# Patient Record
Sex: Female | Born: 1970 | Race: Black or African American | Hispanic: No | Smoking: Former smoker
Health system: Southern US, Community
[De-identification: ages and names within clinical notes are randomized; demographics above are authoritative.]

## PROBLEM LIST (undated history)

## (undated) DIAGNOSIS — K5790 Diverticulosis of intestine, part unspecified, without perforation or abscess without bleeding: Secondary | ICD-10-CM

## (undated) DIAGNOSIS — K8020 Calculus of gallbladder without cholecystitis without obstruction: Secondary | ICD-10-CM

## (undated) DIAGNOSIS — I10 Essential (primary) hypertension: Secondary | ICD-10-CM

## (undated) DIAGNOSIS — IMO0002 Reserved for concepts with insufficient information to code with codable children: Secondary | ICD-10-CM

## (undated) DIAGNOSIS — E079 Disorder of thyroid, unspecified: Secondary | ICD-10-CM

## (undated) DIAGNOSIS — D649 Anemia, unspecified: Secondary | ICD-10-CM

## (undated) HISTORY — DX: Anemia, unspecified: D64.9

## (undated) HISTORY — DX: Reserved for concepts with insufficient information to code with codable children: IMO0002

## (undated) HISTORY — DX: Disorder of thyroid, unspecified: E07.9

## (undated) HISTORY — DX: Diverticulosis of intestine, part unspecified, without perforation or abscess without bleeding: K57.90

## (undated) HISTORY — DX: Essential (primary) hypertension: I10

## (undated) HISTORY — DX: Calculus of gallbladder without cholecystitis without obstruction: K80.20

## (undated) HISTORY — PX: WISDOM TOOTH EXTRACTION: SHX21

---

## 1999-03-14 HISTORY — PX: CHOLECYSTECTOMY: SHX55

## 2010-03-02 ENCOUNTER — Ambulatory Visit: Payer: Self-pay | Admitting: Nurse Practitioner

## 2010-03-02 ENCOUNTER — Encounter (INDEPENDENT_AMBULATORY_CARE_PROVIDER_SITE_OTHER): Payer: Self-pay | Admitting: Internal Medicine

## 2010-03-02 ENCOUNTER — Encounter (INDEPENDENT_AMBULATORY_CARE_PROVIDER_SITE_OTHER): Payer: Self-pay | Admitting: Nurse Practitioner

## 2010-03-02 DIAGNOSIS — I10 Essential (primary) hypertension: Secondary | ICD-10-CM | POA: Insufficient documentation

## 2010-03-02 DIAGNOSIS — R5383 Other fatigue: Secondary | ICD-10-CM

## 2010-03-02 DIAGNOSIS — E669 Obesity, unspecified: Secondary | ICD-10-CM

## 2010-03-02 DIAGNOSIS — N926 Irregular menstruation, unspecified: Secondary | ICD-10-CM

## 2010-03-02 DIAGNOSIS — R5381 Other malaise: Secondary | ICD-10-CM | POA: Insufficient documentation

## 2010-03-03 ENCOUNTER — Encounter (INDEPENDENT_AMBULATORY_CARE_PROVIDER_SITE_OTHER): Payer: Self-pay | Admitting: Nurse Practitioner

## 2010-03-03 DIAGNOSIS — D509 Iron deficiency anemia, unspecified: Secondary | ICD-10-CM

## 2010-03-03 LAB — CONVERTED CEMR LAB
Basophils Absolute: 0 10*3/uL (ref 0.0–0.1)
Basophils Relative: 0 % (ref 0–1)
Eosinophils Absolute: 0.1 10*3/uL (ref 0.0–0.7)
Eosinophils Relative: 3 % (ref 0–5)
HCT: 33.8 % — ABNORMAL LOW (ref 36.0–46.0)
Hemoglobin: 10.4 g/dL — ABNORMAL LOW (ref 12.0–15.0)
Lymphocytes Relative: 29 % (ref 12–46)
Lymphs Abs: 1.5 10*3/uL (ref 0.7–4.0)
MCHC: 30.8 g/dL (ref 30.0–36.0)
MCV: 85.4 fL (ref 78.0–100.0)
Monocytes Absolute: 0.5 10*3/uL (ref 0.1–1.0)
Monocytes Relative: 9 % (ref 3–12)
Neutro Abs: 3.1 10*3/uL (ref 1.7–7.7)
Neutrophils Relative %: 59 % (ref 43–77)
Platelets: 374 10*3/uL (ref 150–400)
RBC: 3.96 M/uL (ref 3.87–5.11)
RDW: 14.2 % (ref 11.5–15.5)
TSH: 1.746 microintl units/mL (ref 0.350–4.500)
WBC: 5.2 10*3/uL (ref 4.0–10.5)

## 2010-03-13 DIAGNOSIS — R87619 Unspecified abnormal cytological findings in specimens from cervix uteri: Secondary | ICD-10-CM

## 2010-03-13 DIAGNOSIS — IMO0002 Reserved for concepts with insufficient information to code with codable children: Secondary | ICD-10-CM

## 2010-03-13 HISTORY — DX: Unspecified abnormal cytological findings in specimens from cervix uteri: R87.619

## 2010-03-13 HISTORY — DX: Reserved for concepts with insufficient information to code with codable children: IMO0002

## 2010-03-17 ENCOUNTER — Ambulatory Visit (HOSPITAL_COMMUNITY)
Admission: RE | Admit: 2010-03-17 | Discharge: 2010-03-17 | Payer: Self-pay | Source: Home / Self Care | Attending: Internal Medicine | Admitting: Internal Medicine

## 2010-03-18 ENCOUNTER — Telehealth (INDEPENDENT_AMBULATORY_CARE_PROVIDER_SITE_OTHER): Payer: Self-pay | Admitting: Nurse Practitioner

## 2010-03-18 DIAGNOSIS — D259 Leiomyoma of uterus, unspecified: Secondary | ICD-10-CM | POA: Insufficient documentation

## 2010-03-28 ENCOUNTER — Encounter (INDEPENDENT_AMBULATORY_CARE_PROVIDER_SITE_OTHER): Payer: Self-pay | Admitting: Nurse Practitioner

## 2010-03-29 ENCOUNTER — Ambulatory Visit
Admission: RE | Admit: 2010-03-29 | Discharge: 2010-03-29 | Payer: Self-pay | Source: Home / Self Care | Attending: Nurse Practitioner | Admitting: Nurse Practitioner

## 2010-03-29 ENCOUNTER — Other Ambulatory Visit: Payer: Self-pay | Admitting: Nurse Practitioner

## 2010-03-29 ENCOUNTER — Encounter (INDEPENDENT_AMBULATORY_CARE_PROVIDER_SITE_OTHER): Payer: Self-pay | Admitting: Nurse Practitioner

## 2010-03-29 ENCOUNTER — Other Ambulatory Visit
Admission: RE | Admit: 2010-03-29 | Discharge: 2010-03-29 | Payer: Self-pay | Source: Home / Self Care | Admitting: Nurse Practitioner

## 2010-03-29 LAB — CONVERTED CEMR LAB
Ketones, urine, test strip: NEGATIVE
Nitrite: NEGATIVE
Rapid HIV Screen: NEGATIVE
Urobilinogen, UA: NEGATIVE
pH: 6.5

## 2010-03-29 LAB — CYTOLOGY - PAP

## 2010-03-30 ENCOUNTER — Encounter (INDEPENDENT_AMBULATORY_CARE_PROVIDER_SITE_OTHER): Payer: Self-pay | Admitting: Nurse Practitioner

## 2010-03-30 DIAGNOSIS — E7800 Pure hypercholesterolemia, unspecified: Secondary | ICD-10-CM | POA: Insufficient documentation

## 2010-03-30 LAB — CONVERTED CEMR LAB
Albumin: 4.3 g/dL (ref 3.5–5.2)
CO2: 23 meq/L (ref 19–32)
Calcium: 9.4 mg/dL (ref 8.4–10.5)
Chloride: 105 meq/L (ref 96–112)
Cholesterol: 222 mg/dL — ABNORMAL HIGH (ref 0–200)
Glucose, Bld: 85 mg/dL (ref 70–99)
Potassium: 4.2 meq/L (ref 3.5–5.3)
Sodium: 142 meq/L (ref 135–145)
Total Bilirubin: 0.5 mg/dL (ref 0.3–1.2)
Total Protein: 7.2 g/dL (ref 6.0–8.3)
Triglycerides: 60 mg/dL (ref <150)

## 2010-03-31 ENCOUNTER — Telehealth (INDEPENDENT_AMBULATORY_CARE_PROVIDER_SITE_OTHER): Payer: Self-pay | Admitting: Nurse Practitioner

## 2010-04-08 ENCOUNTER — Ambulatory Visit (HOSPITAL_COMMUNITY)
Admission: RE | Admit: 2010-04-08 | Discharge: 2010-04-08 | Payer: Self-pay | Source: Home / Self Care | Attending: Internal Medicine | Admitting: Internal Medicine

## 2010-04-11 ENCOUNTER — Other Ambulatory Visit: Payer: Self-pay | Admitting: Family Medicine

## 2010-04-11 ENCOUNTER — Ambulatory Visit
Admission: RE | Admit: 2010-04-11 | Discharge: 2010-04-11 | Payer: Self-pay | Source: Home / Self Care | Attending: Obstetrics and Gynecology | Admitting: Obstetrics and Gynecology

## 2010-04-11 ENCOUNTER — Encounter: Payer: Self-pay | Admitting: Obstetrics & Gynecology

## 2010-04-11 LAB — CONVERTED CEMR LAB
HCT: 35.5 % — ABNORMAL LOW (ref 36.0–46.0)
Hemoglobin: 11.2 g/dL — ABNORMAL LOW (ref 12.0–15.0)
MCHC: 31.5 g/dL (ref 30.0–36.0)
MCV: 86.4 fL (ref 78.0–100.0)
RDW: 14.6 % (ref 11.5–15.5)

## 2010-04-12 ENCOUNTER — Encounter (INDEPENDENT_AMBULATORY_CARE_PROVIDER_SITE_OTHER): Payer: Self-pay | Admitting: Nurse Practitioner

## 2010-04-12 ENCOUNTER — Encounter: Payer: Self-pay | Admitting: Nurse Practitioner

## 2010-04-12 ENCOUNTER — Ambulatory Visit: Admit: 2010-04-12 | Payer: Self-pay | Admitting: Nurse Practitioner

## 2010-04-12 LAB — CONVERTED CEMR LAB
BUN: 12 mg/dL (ref 6–23)
Creatinine, Ser: 0.86 mg/dL (ref 0.40–1.20)

## 2010-04-12 NOTE — Progress Notes (Signed)
NAME:  [name], [name] NO.:  [id number]  MEDICAL RECORD NO.:  [id number]          PATIENT TYPE:  WOC  LOCATION:  [facility]                   FACILITY:  [facility]  PHYSICIAN:  Maryelizabeth Kaufmann, MD  DATE OF BIRTH:  02/04/1971  DATE OF SERVICE:                                 CLINIC NOTE  CHIEF COMPLAINT:  Uterine fibroids, heavy periods, and abnormal Pap smear evaluation.  HISTORY OF PRESENT ILLNESS:  This is a 40 year old gravida 2, para 2-0-0- 2 who is in for evaluation of heavy periods, which she says last approximately 10-14 days with use of increased pads and clots with dysmenorrhea along with anemia secondary to her menorrhagia.  Otherwise, she denies any vaginal discharge, itching, or odor.  She does have a family history with significant for uterine fibroids in her mother, which results in an hysterectomy in her early 30s per her report.  She states that she has had hormonal contraceptives in the past and is not currently clinically amendable to hormonal contraceptive at this time. Otherwise, she denies any other acute complaints other than she states she does have some PMS symptoms, some coldness due to the anemia, some fatigue as well due to the anemia.  PAST MEDICAL HISTORY:  Significant for hypertension and iron-deficiency anemia, currently controlled with hydrochlorothiazide and iron.  PAST SURGICAL HISTORY:  Significant for tonsillectomy and cholecystectomy.  MEDICATIONS: 1. Hydrochlorothiazide 25 mg one tab p.o. daily. 2. Iron 325 one tab p.o. daily.  SOCIAL HISTORY:  She denies any tobacco, alcohol, or drugs.  She is single and unemployed, but she does work as a Programmer, multimedia.  OB HISTORY:  She is gravida 2, para 2-0-0-2, status post full-term spontaneous vaginal delivery with her largest baby weighing 7 pounds and 14 ounces.  She is not on no form of contraception at this point.  GYN HISTORY:  She had menarche age 51, which are regular.  She  states her periods are currently lasting 12 days and are heavy.  She has no history of hormonal contraception.  No history of DVT or PE and this is her first abnormal Pap smear as well for which she is currently __________ evaluation for it.  FAMILY HISTORY:  Significant for her mother that had a hysterectomy secondary to fibroids as well as breast cancer diagnosed at age 68, status post mastectomy in her sister and also status post breast cancer diagnosis and treatment at age 89 as well.  She denies any history of BRCA gene testing at this time and her mammogram is currently up-to-date and she denies any history of problems with that.  PHYSICAL EXAMINATION:  VITAL SIGNS:  Blood pressure 139/87, pulse 80, temperature 98.9, and weight 213.9. GENERAL:  No acute distress.  Alert and oriented x4. CARDIOVASCULAR:  Regular rate and rhythm.  No murmurs, rubs, or gallops. CHEST:  Clear to auscultation bilaterally.  No wheezing, rales, or rhonchi. ABDOMEN:  Positive bowel sounds.  Soft, nontender, and nondistended. EXTREMITIES:  No clubbing, cyanosis, or edema. GU:  Normal external genitalia.  Sterile speculum exam with normal vaginal mucosa and cervix appears parous, but otherwise is normal without any active bleeding that were visible.  A Pap smear was performed slowly for the HPV testing.  Bimanual exam with a retroverted uterus, unable to palpate any fibroids and adnexa was otherwise nontender.  LABORATORY DATA:  Significant for ASCUS on her Pap smear.  There was no HPV result and she also had a transabdominal and transvaginal ultrasound of her pelvis, which showed her uterus was retroverted with multiple fibroids.  She had one in the left corneal lesion measuring 1.6 x 1.5 x 1.3 with a large submucosal component approximately 75% measuring 1.1 x 0.9 x 1.4 and in the frontal lesion, she had a partial subserosal component measuring 1.6 x 1.0 x 1.3 cm.  ASSESSMENT AND PLAN: 1. This is a  40 year old gravida 2, para 2-0-0-2 who presents for     evaluation of menorrhagia secondary to uterine fibroids along with     iron-deficiency anemia. 2. Abnormal Pap smear notable for ASCUS and no HPV testing was     reported at this time.  PLAN: 1. In regards to her uterine fibroids, menorrhagia, multiple options     were discussed with the patient along with Dr. Penne Lash as well who     presented for additional consultation.  I discussed with the     patient possible hormonal contraceptive including but not limited     to Depo-Provera or OCPs.  The patient declines due to the possible     side effects of headaches or hormonal dysregulation; however, she     did decide that she wanted to be with Depo-Lupron shot after     further consultation, to decrease the bleeding to repair for     possible surgery.  She was initially interested in hysterectomy;     however, the patient was counseled in regards to the extensive     nature of that surgery and the prolonged recovery time as well as     the fact that she does have a small subserosal fibroid that could     possibly be resected as well as ablation.  The patient was     counseled in regards to the risks, benefits, and alternatives of     this procedure including but not limited to possible infertility;     however, she does not desire any further children at this time.     She states that she is done with her childbearing.  Further     complications include risks of bleeding, infection, possible     uterine perforation and also need for possible hysterectomy if     ablation does not result in success.  The patient agrees to proceed     with myomectomy with thermal ablation.  In regards to her fibroids,     we will also have the patient to return to clinic in 2 weeks for     followup of her result, which would be her CBC today as well as     endometrial biopsy to exclude malignancy prior to her thermal     ablation, which will be  scheduled in approximately 4-6 weeks. 2. Iron-deficiency anemia.  The patient will continue with her iron     supplementation. 3. Abnormal Pap smear.  We did a HPV testing today and if her HPV     testing is negative, we will have the patient to otherwise get     another Pap smear in a year.  Otherwise if it is positive, she will     be referred for colposcopy.  ______________________________ Maryelizabeth Kaufmann, MD    LC/MEDQ  D:  04/11/2010  T:  04/12/2010  Job:  [id number]

## 2010-04-13 ENCOUNTER — Encounter (INDEPENDENT_AMBULATORY_CARE_PROVIDER_SITE_OTHER): Payer: Self-pay | Admitting: Nurse Practitioner

## 2010-04-14 LAB — CONVERTED CEMR LAB
Iron: 35 ug/dL — ABNORMAL LOW (ref 42–145)
Saturation Ratios: 9 % — ABNORMAL LOW (ref 20–55)
TIBC: 405 ug/dL (ref 250–470)
UIBC: 370 ug/dL

## 2010-04-14 NOTE — Progress Notes (Signed)
Summary: PAP results  Phone Note Outgoing Call   Summary of Call: notify pt that PAP is abnormal.  There are some abnormal cells of undetermined signficance.  Ordinarily I would repeat PAP in 3-4 months but pt is going to GYN so she can let them know about her abnormal PAP at that time.  Current GYN problem may have something to do with abnormal PAP - again pt should review during GYN visit Initial call taken by: Lehman Prom FNP,  March 31, 2010 1:19 PM  Follow-up for Phone Call        pt informed of above information and will come by the office to pick up a copy of pap results to take with her to GYN appt. Follow-up by: Levon Hedger,  April 01, 2010 2:53 PM

## 2010-04-14 NOTE — Letter (Signed)
Summary: Lipid Letter  [facility]  [address] [place], [place] [address]   Phone: [phone]  Fax: [phone]    03/30/2010  [name] [address] [place], [place]  [address]  Dear Okey Regal:  We have carefully reviewed your last lipid profile from 03/29/2010 and the results are noted below with a summary of recommendations for lipid management.    Cholesterol:      222    Goal: Less than 200   HDL "good" Cholesterol:   70     Goal: greater than 40   LDL "bad" Cholesterol:   140     Goal: less than 100   Triglycerides:       60     Goal: less than 150  Labs done during recent office visit shows that your cholesterol is slightly elevated.  No need for medication at this time but you should avoid fried, fatty foods in attempts to lower cholesterol.  Pap Smear results _______________________.    Current Medications: 1)    Ferrous Sulfate 325 (65 Fe) Mg Tbec (Ferrous sulfate) .... One tablet by mouth daily 2)    Hydrochlorothiazide 25 Mg Tabs (Hydrochlorothiazide) .... Take one (1) by mouth daily  If you have any questions, please call. We appreciate being able to work with you.   Sincerely,    Lehman Prom, FNP [facility]

## 2010-04-14 NOTE — Letter (Signed)
Summary: Handout Printed  Printed Handout:  - Fibroids (Leiomyoma's) 

## 2010-04-14 NOTE — Assessment & Plan Note (Signed)
Summary: Complete Physical Exam   Vital Signs:  Patient profile:   40 year old female Menstrual status:  regular LMP:     03/15/2010 Weight:      214.5 pounds BMI:     32.73 Temp:     97.3 degrees F oral Pulse rate:   80 / minute Pulse rhythm:   regular Resp:     20 per minute BP sitting:   144 / 90  (left arm) Cuff size:   regular  Vitals Entered By: Levon Hedger (March 29, 2010 9:48 AM)  Nutrition Counseling: Patient's BMI is greater than 25 and therefore counseled on weight management options. CC: CPP, Hypertension Management Is Patient Diabetic? No Pain Assessment Patient in pain? no       Does patient need assistance? Functional Status Self care Ambulation Normal LMP (date): 03/15/2010 LMP - Character: heavy    Menses interval (days): 28 Menstrual flow (days): 5 Menstrual Status regular Enter LMP: 03/15/2010   CC:  CPP and Hypertension Management.  History of Present Illness: Pt into the office for a complete physical exam  PAP - last done over 1 year ago.  All previous have been normal.  Ultrasound done 03/17/2010  - Fibroid uterus with fibroid sizes and locations as noted above. One fibroid appears to have a substantial submucosal component estimated to be approximately 75% on this exam.  This could be better evaluated with sono hysterography if desired.  More complete endometrial evaluation could also be achieved with sono hysterography as full endometrial evaluation is difficult today due to the submucosal fibroid.  2 children and pt has no desire to continue having children Pt is in a steady relationship.    Mammogram - mother with history of breast cancer no history of mammogram. some self breast exams at home but not consecutive  Optho - no glasses or contacts. overdue for eye exam  Dental - no recent dental exam.    tdap - last 5 years ago   Hypertension History:      She denies headache, chest pain, and palpitations.  She notes no  problems with any antihypertensive medication side effects.        Positive major cardiovascular risk factors include hypertension.  Negative major cardiovascular risk factors include female age less than 25 years old, no history of diabetes or hyperlipidemia, and non-tobacco-user status.        Further assessment for target organ damage reveals no history of ASHD, cardiac end-organ damage (CHF/LVH), stroke/TIA, peripheral vascular disease, renal insufficiency, or hypertensive retinopathy.     Habits & Providers  Alcohol-Tobacco-Diet     Alcohol drinks/day: <1     Alcohol Counseling: not indicated; use of alcohol is not excessive or problematic     Alcohol type: wine     Tobacco Status: never  Exercise-Depression-Behavior     Does Patient Exercise: yes     Exercise Counseling: to improve exercise regimen     Type of exercise: walk     Have you felt down or hopeless? no     Have you felt little pleasure in things? no     Depression Counseling: not indicated; screening negative for depression     Drug Use: never  Comments: PHQ-9 SCORE = 2  Medications Prior to Update: 1)  Ferrous Sulfate 325 (65 Fe) Mg Tbec (Ferrous Sulfate) .... One Tablet By Mouth Daily  Allergies (verified): No Known Drug Allergies  Family History: mother - htn, breast cancer father - htn 2 brothers  1 sister with breast cancer 1 sister with htn  Review of Systems General:  Denies fever. Eyes:  Denies blurring. ENT:  Denies earache. CV:  Denies chest pain or discomfort. Resp:  Denies cough. GI:  Denies abdominal pain, nausea, and vomiting. GU:  Denies discharge. MS:  Denies joint pain. Derm:  Denies dryness. Neuro:  Denies headaches. Psych:  Denies anxiety and depression. Endo:  Denies excessive urination.  Physical Exam  General:  alert.   Head:  normocephalic.   Eyes:  pupils round.   Ears:  bil TM with bony landmarks present Nose:  no nasal discharge.   Mouth:  poor dentition.   Neck:   supple.   Chest Wall:  no mass.   Breasts:  no masses and no abnormal thickening.   Lungs:  normal breath sounds.   Heart:  normal rate and regular rhythm.   Abdomen:  normal bowel sounds.   Rectal:  defer Genitalia:  normal introitus.   Msk:  up to the exam table Extremities:  no edema Neurologic:  alert & oriented X3.   Skin:  color normal.   Psych:  Oriented X3.    Pelvic Exam  Vulva:      normal appearance.   Urethra and Bladder:      Urethra--no discharge.  Bladder--normal.   Vagina:      physiologic discharge.   Cervix:      nulliparous.   Uterus:      retroflexed, fibroids.   Adnexa:      nontender bilaterally.   Rectum:      defer    Impression & Recommendations:  Problem # 1:  ROUTINE GYNECOLOGICAL EXAMINATION (ICD-V72.31) phq-9 score = 2 EKG done PAP done rec optho and dental exam Orders: KOH/ WET Mount [phone]) Pap Smear, Thin Prep ( Collection of) (Q0091) UA Dipstick w/o Micro (manual) (78295) Rapid HIV  (92370)  Problem # 2:  BREAST CANCER, FAMILY HX (ICD-V16.3) self breast exam placcard given to pt will schedule mammogram due to family history Orders: Mammogram (Screening) (Mammo)  Problem # 3:  FIBROIDS, UTERUS (ICD-218.9) noted on last ultrasound pt has been referred to GYN  Problem # 4:  HYPERTENSION, BENIGN ESSENTIAL (ICD-401.1) pt admits that BP has been elevated will start meds Her updated medication list for this problem includes:    Hydrochlorothiazide 25 Mg Tabs (Hydrochlorothiazide) .Marland Kitchen... Take one (1) by mouth daily  Orders: UA Dipstick w/o Micro (manual) (62130) T-Comprehensive Metabolic Panel (86578-46962) T-Lipid Profile (95284-13244) EKG w/ Interpretation (93000)  Problem # 5:  ANEMIA, IRON DEFICIENCY (ICD-280.9) ongoing due to heavy cycles associated with fibroid Her updated medication list for this problem includes:    Ferrous Sulfate 325 (65 Fe) Mg Tbec (Ferrous sulfate) ..... One tablet by mouth daily  Complete  Medication List: 1)  Ferrous Sulfate 325 (65 Fe) Mg Tbec (Ferrous sulfate) .... One tablet by mouth daily 2)  Hydrochlorothiazide 25 Mg Tabs (Hydrochlorothiazide) .... Take one (1) by mouth daily  Other Orders: T-Chlamydia Probe, genital [phone])  Hypertension Assessment/Plan:      The patient's hypertensive risk group is category A: No risk factors and no target organ damage.  Today's blood pressure is 144/90.  Her blood pressure goal is < 140/90.  Patient Instructions: 1)  Keep appointment for mammogram. 2)  Blood pressure - still slightly elevated today. Keep on your efforts at dietary management but will add a low dose diuretic - HCTZ 25mg  by mouth daily.  Remember to limit your sodium intake  3)  Schedule a dental appointment (see list) 4)  Follow up in 2 weeks for blood pressure check with triage nurse 5)  Goal < 135/90.  Will need BMET Prescriptions: HYDROCHLOROTHIAZIDE 25 MG TABS (HYDROCHLOROTHIAZIDE) Take one (1) by mouth daily  #30 x 5   Entered and Authorized by:   Lehman Prom FNP   Signed by:   Lehman Prom FNP on 03/29/2010   Method used:   Print then Give to Patient   RxID:   [id number]    Orders Added: 1)  Est. Patient age 72-39 [99395] 2)  KOH/ WET Mount [phone] 3)  Pap Smear, Thin Prep ( Collection of) [Q0091] 4)  UA Dipstick w/o Micro (manual) [81002] 5)  Rapid HIV  [92370] 6)  T-Comprehensive Metabolic Panel [80053-22900] 7)  T-Lipid Profile [80061-22930] 8)  EKG w/ Interpretation [93000] 9)  Mammogram (Screening) [Mammo] 10)  T-Chlamydia Probe, genital [34742-59563]    Prevention & Chronic Care Immunizations   Influenza vaccine: Fluvax 3+  (03/02/2010)    Tetanus booster: 03/13/2005: historical per pt    Pneumococcal vaccine: Not documented  Other Screening   Pap smear: Not documented   Smoking status: never  (03/29/2010)  Lipids   Total Cholesterol: Not documented   LDL: Not documented   LDL Direct: Not documented   HDL:  Not documented   Triglycerides: Not documented  Hypertension   Last Blood Pressure: 144 / 90  (03/29/2010)   Serum creatinine: Not documented   Serum potassium Not documented CMP ordered   Self-Management Support :    Hypertension self-management support: Not documented  Laboratory Results   Urine Tests  Date/Time Received: March 29, 2010 1:33 PM   Routine Urinalysis   Color: yellow Glucose: negative   (Normal Range: Negative) Bilirubin: negative   (Normal Range: Negative) Ketone: negative   (Normal Range: Negative) Spec. Gravity: 1.025   (Normal Range: 1.003-1.035) Blood: negative   (Normal Range: Negative) pH: 6.5   (Normal Range: 5.0-8.0) Protein: negative   (Normal Range: Negative) Urobilinogen: negative   (Normal Range: 0-1) Nitrite: negative   (Normal Range: Negative) Leukocyte Esterace: negative   (Normal Range: Negative)    Date/Time Received: March 29, 2010 12:41 PM   Wet Mount/KOH Source: vaginal WBC/hpf: 1-5 Bacteria/hpf: rare Clue cells/hpf: none Yeast/hpf: none Trichomonas/hpf: none  Other Tests  Rapid HIV: negative   Laboratory Results   Urine Tests    Routine Urinalysis   Color: yellow Glucose: negative   (Normal Range: Negative) Bilirubin: negative   (Normal Range: Negative) Ketone: negative   (Normal Range: Negative) Spec. Gravity: 1.025   (Normal Range: 1.003-1.035) Blood: negative   (Normal Range: Negative) pH: 6.5   (Normal Range: 5.0-8.0) Protein: negative   (Normal Range: Negative) Urobilinogen: negative   (Normal Range: 0-1) Nitrite: negative   (Normal Range: Negative) Leukocyte Esterace: negative   (Normal Range: Negative)      Wet Mount Wet Mount KOH: Negative  Other Tests  Rapid HIV: negative

## 2010-04-14 NOTE — Letter (Signed)
Summary: Handout Printed  Printed Handout:  - Diet - Iron Rich 

## 2010-04-14 NOTE — Assessment & Plan Note (Signed)
Summary: NEW - Establish Care   Vital Signs:  Patient profile:   40 year old female Menstrual status:  regular LMP:     02/10/2010 Height:      68 inches Weight:      215.44 pounds BMI:     32.88 Temp:     98.2 degrees F oral Pulse rate:   88 / minute Pulse rhythm:   regular Resp:     14 per minute BP sitting:   146 / 94  (left arm) Cuff size:   regular  Vitals Entered By: Hale Drone CMA (March 02, 2010 9:48 AM)  Nutrition Counseling: Patient's BMI is greater than 25 and therefore counseled on weight management options. CC: Here to establish care. Concerned about her menstruals. The last 3 have been heavy w/blood clots. Right knee will swell up once in awhile.  Is Patient Diabetic? No Pain Assessment Patient in pain? no       Does patient need assistance? Functional Status Self care Ambulation Normal LMP (date): 02/10/2010 LMP - Character: heavy    Menses interval (days): 28 Menstrual flow (days): 5 Menstrual Status regular Enter LMP: 02/10/2010   CC:  Here to establish care. Concerned about her menstruals. The last 3 have been heavy w/blood clots. Right knee will swell up once in awhile. Marland Kitchen  History of Present Illness:  Pt into the office to establish care. Pt recently relocated from [place]. No chronic medical history  Menstrual cycle changes - started 1 year ago.  menses started to get heavy. The last 3 menstrual cycles have been VERY heavy with clots and heavy flow. normal cycle lasts for 5 days but for past 3 months it has lasted for 10 days. +fatigue +weakness Some cramping 2 older sister with normal menstrual cycles. Mother has hysterectomy Never had PMS symptoms before  2 children are ages 47 and 48  Habits & Providers  Alcohol-Tobacco-Diet     Alcohol drinks/day: <1     Alcohol Counseling: not indicated; use of alcohol is not excessive or problematic     Alcohol type: wine     Tobacco Status: never  Exercise-Depression-Behavior  Does Patient Exercise: yes     Exercise Counseling: to improve exercise regimen     Type of exercise: walk     Drug Use: never  Current Medications (verified): 1)  None  Allergies (verified): No Known Drug Allergies  Past History:  Past Surgical History: Cholecystectomy 2001 Tonsillectomy  Family History: mother - htn, breast cancder father - htn 2 brothers 1 sister with breast cancer 1 sister with htn  Social History: 2 children divorced tobacco - none ETOH - social  Drug - noneSmoking Status:  never Drug Use:  never Does Patient Exercise:  yes  Review of Systems General:  Denies fever. CV:  Denies chest pain or discomfort. Resp:  Denies cough. GI:  Denies abdominal pain, nausea, and vomiting. GU:  Complains of abnormal vaginal bleeding.  Physical Exam  General:  alert.   Head:  normocephalic.   Ears:  ear piercing(s) noted.   Lungs:  normal breath sounds.   Heart:  normal rate and regular rhythm.   Abdomen:  normal bowel sounds.   Msk:  normal ROM.   Neurologic:  alert & oriented X3.   Skin:  color normal.   Psych:  Oriented X3.     Impression & Recommendations:  Problem # 1:  ELEVATED BLOOD PRESSURE (ICD-796.2) DASH diet handout given to pt  Problem # 2:  IRREGULAR MENSES (ICD-626.4) will order u/s handout given Orders: Ultrasound (Ultrasound)  Problem # 3:  FATIGUE (ICD-780.79) will chck cbc  may be due to increase blood loss from menses Orders: T-CBC w/Diff (19147-82956) T-TSH (21308-65784) Ultrasound (Ultrasound)  Problem # 4:  OBESITY (ICD-278.00) reviewed with pt she needs to increase exercise  Problem # 5:  NEED PROPHYLACTIC VACCINATION&INOCULATION FLU (ICD-V04.81) given today  Other Orders: Flu Vaccine 39yrs + (69629) Admin 1st Vaccine (52841)  Patient Instructions: 1)  Blood pressure - read handout 2)  Monitor blood pressure. Normal is 120/80  but your goal is less than 130/90. 3)  You need to try lifestyle  modifications to lower blood pressure.  If it is consecutively high then you may need a low dose of medications 4)  Heavy periods - your labs will be checked today and you will be notified of the results. 5)  You will be scheduled for an ultrasound. Be sure to keep this appoitment 6)  you have been given the flu vaccine today 7)  Labs to check blood count and thyroid have been done today and you will be informed of the results 8)  Schedule an appointment for a complete physical exam. 9)  No food after midnight before this visit - you will need lipids. 10)  you will also need mammogram, PAP, u/a. 11)  If blood pressure is still up then may consider medication   Orders Added: 1)  Flu Vaccine 42yrs + [90658] 2)  Admin 1st Vaccine [90471] 3)  New Patient Level III [99203] 4)  T-CBC w/Diff [32440-10272] 5)  T-TSH [53664-40347] 6)  Ultrasound [Ultrasound]   Immunizations Administered:  Influenza Vaccine # 1:    Vaccine Type: Fluvax 3+    Site: left deltoid    Mfr: GlaxoSmithKline    Dose: 0.5 ml    Route: IM    Given by: Hale Drone CMA    Exp. Date: 09/10/2010    Lot #: [id number]    VIS given: 10/05/09 version given March 02, 2010.  Flu Vaccine Consent Questions:    Do you have a history of severe allergic reactions to this vaccine? no    Any prior history of allergic reactions to egg and/or gelatin? no    Do you have a sensitivity to the preservative Thimersol? no    Do you have a past history of Guillan-Barre Syndrome? no    Do you currently have an acute febrile illness? no    Have you ever had a severe reaction to latex? no    Vaccine information given and explained to patient? yes    Are you currently pregnant? no   Immunizations Administered:  Influenza Vaccine # 1:    Vaccine Type: Fluvax 3+    Site: left deltoid    Mfr: GlaxoSmithKline    Dose: 0.5 ml    Route: IM    Given by: Hale Drone CMA    Exp. Date: 09/10/2010    Lot #: [id number]    VIS given: 10/05/09  version given March 02, 2010.   Prevention & Chronic Care Immunizations   Influenza vaccine: Fluvax 3+  (03/02/2010)    Tetanus booster: Not documented    Pneumococcal vaccine: Not documented  Other Screening   Pap smear: Not documented   Smoking status: never  (03/02/2010)  Lipids   Total Cholesterol: Not documented   LDL: Not documented   LDL Direct: Not documented   HDL: Not documented   Triglycerides: Not documented   Nursing  Instructions: Give Flu vaccine today

## 2010-04-14 NOTE — Letter (Signed)
Summary: Handout Printed  Printed Handout:  - Menorrhagia (Heavy Periods)

## 2010-04-14 NOTE — Progress Notes (Signed)
Summary: Office Visit/DEPRESSION SCREENIG  Office Visit/DEPRESSION SCREENIG   Imported By: Arta Bruce 03/29/2010 11:10:13  _____________________________________________________________________  External Attachment:    Type:   Image     Comment:   External Document

## 2010-04-14 NOTE — Letter (Signed)
Summary: TEST ORDER FORM MAMMOGRAM//APPT DATE & TIME  TEST ORDER FORM MAMMOGRAM//APPT DATE & TIME   Imported By: Arta Bruce 04/01/2010 15:34:53  _____________________________________________________________________  External Attachment:    Type:   Image     Comment:   External Document

## 2010-04-14 NOTE — Letter (Signed)
Summary: MED/SOLUTIONS//APPROVED  MED/SOLUTIONS//APPROVED   Imported By: Arta Bruce 03/02/2010 16:45:56  _____________________________________________________________________  External Attachment:    Type:   Image     Comment:   External Document

## 2010-04-14 NOTE — Progress Notes (Signed)
Summary: U/S Results  Phone Note Outgoing Call   Summary of Call: notify pt that u/s shows that she has several fibroids. this is likely causing the fatigue, heavy periods and cramping.   Sometimes pt manage symptoms with anti-inflammatories during menses and heat pad while they do some research on the topic.  Others prefer to go to GYN for further evaluation and management.  If pt wants GYN referral then let me know and i will be happy to make the referral and she will be informed of the time/date of the appt Initial call taken by: Lehman Prom FNP,  March 18, 2010 2:20 PM  Follow-up for Phone Call        pt informed and would like to be referred to GYN for further evaluation. Follow-up by: Levon Hedger,  March 21, 2010 8:39 AM  Additional Follow-up for Phone Call Additional follow up Details #1::        Arna Medici - refer pt to gyn be sure to attach recent ultrasound results Additional Follow-up by: Lehman Prom FNP,  March 21, 2010 6:44 PM  New Problems: FIBROIDS, UTERUS (ICD-218.9)   Additional Follow-up for Phone Call Additional follow up Details #2::    I SEND THE REFERRAL TO [facility] WAITING FOR AN APPT  Follow-up by: Cheryll Dessert,  March 22, 2010 9:28 AM  New Problems: FIBROIDS, UTERUS (ICD-218.9) Phone Note Outgoing Call   Summary of Call: notify pt that u/s shows that she has several fibroids. this is likely causing the fatigue, heavy periods and cramping.   Sometimes pt manage symptoms with anti-inflammatories during menses and heat pad while they do some research on the topic.  Others prefer to go to GYN for further evaluation and management.  If pt wants GYN referral then let me know and i will be happy to make the referral and she will be informed of the time/date of the appt Initial call taken by: Lehman Prom FNP,  March 18, 2010 2:20 PM  Follow-up for Phone Call        pt informed and would like to be referred to GYN for further  evaluation. Follow-up by: Levon Hedger,  March 21, 2010 8:39 AM  Additional Follow-up for Phone Call Additional follow up Details #1::        Arna Medici - refer pt to gyn be sure to attach recent ultrasound results Additional Follow-up by: Lehman Prom FNP,  March 21, 2010 6:44 PM  New Problems: FIBROIDS, UTERUS (ICD-218.9)   Additional Follow-up for Phone Call Additional follow up Details #2::    I SEND THE REFERRAL TO [facility] WAITING FOR AN APPT  Follow-up by: Cheryll Dessert,  March 22, 2010 9:28 AM  New Problems: FIBROIDS, UTERUS (ICD-218.9)

## 2010-04-20 NOTE — Assessment & Plan Note (Signed)
Summary: BP recheck    Vital Signs:  Patient profile:   40 year old female Menstrual status:  regular Weight:      214.8 pounds Temp:     98.3 degrees F oral Pulse rate:   68 / minute Pulse rhythm:   regular Resp:     20 per minute BP sitting:   122 / 80  (left arm) Cuff size:   regular  Vitals Entered By: Dutch Quint RN (April 12, 2010 9:40 AM)   Physical Exam  General:  alert, well-developed, well-nourished, and well-hydrated.     Impression & Recommendations:  Problem # 1:  HYPERTENSION, BENIGN ESSENTIAL (ICD-401.1)  BP below goal - 122/80 Continue meds BMET done F/U 3-4 months with provider  Her updated medication list for this problem includes:    Hydrochlorothiazide 25 Mg Tabs (Hydrochlorothiazide) .Marland Kitchen... Take one (1) by mouth daily  Orders: T-Basic Metabolic Panel [phone])  Complete Medication List: 1)  Ferrous Sulfate 325 (65 Fe) Mg Tbec (Ferrous sulfate) .... One tablet by mouth daily 2)  Hydrochlorothiazide 25 Mg Tabs (Hydrochlorothiazide) .... Take one (1) by mouth daily   Review of Systems CV:  Denies CP, SOB, dizziness, visual changes, peripheral edema or current HA. Had HA yesterday, resolved with ibuprofen, gets headaches once in awhile, less frequently than she used to..   CC:  BP recheck.  History of Present Illness: 03/29/10 BP 144/90 P 80.  Started HCTZ 25 mg. after visit, missed only one dose, has taken daily.  Denies adverse effects.  Here for BP recheck and BMET.   Patient Instructions: 1)  Your blood pressure is good -- 122/80. 2)  Continue medications as ordered. 3)  We will let you know the results of your lab work. 4)  Make follow-up appointment to see provider in 3-4 months.  You will have to call us back to make appointment. 5)  Call if you have any questions, or if anything changes.  CC: BP recheck Is Patient Diabetic? No Pain Assessment Patient in pain? no       Does patient need assistance? Functional Status  Self care Ambulation Normal

## 2010-04-20 NOTE — Letter (Signed)
Summary: *HSN Results Follow up  [facility]  [address] [place], [place] [address]   Phone: [phone]  Fax: [phone]      04/13/2010   LUVERNA DEGENHART Sant[address]ev[address] [place], [place]  [address]   Dear  Ms. Hollis Thole,                            ____S.Drinkard,FNP   ____D. Gore,FNP       ____B. McPherson,MD   ____V. Rankins,MD    ____E. Mulberry,MD    _X___N. Daphine Deutscher, FNP  ____D. Reche Dixon, MD    ____K. Philipp Deputy, MD    ____Other     This letter is to inform you that your recent test(s):  _______Pap Smear    ____X___Lab Test     _______X-ray    ___X____ is within acceptable limits  _______ requires a medication change  _______ requires a follow-up lab visit  _______ requires a follow-up visit with your provider   Comments: Labs done during your recent office visit are normal.       _________________________________________________________ If you have any questions, please contact our office [phone].                    Sincerely,    Lehman Prom FNP [facility]

## 2010-04-25 ENCOUNTER — Ambulatory Visit (INDEPENDENT_AMBULATORY_CARE_PROVIDER_SITE_OTHER): Payer: Medicaid Other | Admitting: Occupational Therapy

## 2010-04-25 DIAGNOSIS — R87610 Atypical squamous cells of undetermined significance on cytologic smear of cervix (ASC-US): Secondary | ICD-10-CM

## 2010-04-25 DIAGNOSIS — D259 Leiomyoma of uterus, unspecified: Secondary | ICD-10-CM

## 2010-05-02 ENCOUNTER — Other Ambulatory Visit: Payer: Self-pay | Admitting: Obstetrics and Gynecology

## 2010-05-02 ENCOUNTER — Encounter (INDEPENDENT_AMBULATORY_CARE_PROVIDER_SITE_OTHER): Payer: Medicaid Other | Admitting: Obstetrics and Gynecology

## 2010-05-02 ENCOUNTER — Other Ambulatory Visit (HOSPITAL_COMMUNITY)
Admission: RE | Admit: 2010-05-02 | Discharge: 2010-05-02 | Disposition: A | Discharge status: Home / Self Care | Payer: Medicaid Other | Source: Ambulatory Visit | Attending: Physician Assistant | Admitting: Physician Assistant

## 2010-05-02 DIAGNOSIS — Z124 Encounter for screening for malignant neoplasm of cervix: Secondary | ICD-10-CM | POA: Insufficient documentation

## 2010-05-02 DIAGNOSIS — R87610 Atypical squamous cells of undetermined significance on cytologic smear of cervix (ASC-US): Secondary | ICD-10-CM

## 2010-05-02 LAB — POCT PREGNANCY, URINE: Preg Test, Ur: NEGATIVE

## 2010-05-09 ENCOUNTER — Ambulatory Visit (INDEPENDENT_AMBULATORY_CARE_PROVIDER_SITE_OTHER): Payer: Medicaid Other | Admitting: Obstetrics & Gynecology

## 2010-05-09 DIAGNOSIS — N926 Irregular menstruation, unspecified: Secondary | ICD-10-CM

## 2010-05-09 DIAGNOSIS — N939 Abnormal uterine and vaginal bleeding, unspecified: Secondary | ICD-10-CM

## 2010-05-09 DIAGNOSIS — R87610 Atypical squamous cells of undetermined significance on cytologic smear of cervix (ASC-US): Secondary | ICD-10-CM

## 2010-05-12 HISTORY — PX: ENDOMETRIAL ABLATION: SHX621

## 2010-05-16 ENCOUNTER — Ambulatory Visit: Payer: Medicaid Other | Admitting: Occupational Therapy

## 2010-05-24 ENCOUNTER — Ambulatory Visit (HOSPITAL_COMMUNITY)
Admission: RE | Admit: 2010-05-24 | Discharge: 2010-05-24 | Disposition: A | Discharge status: Home / Self Care | Payer: Medicaid Other | Source: Ambulatory Visit | Attending: Obstetrics & Gynecology | Admitting: Obstetrics & Gynecology

## 2010-05-24 ENCOUNTER — Other Ambulatory Visit: Payer: Self-pay | Admitting: Obstetrics & Gynecology

## 2010-05-24 DIAGNOSIS — D250 Submucous leiomyoma of uterus: Secondary | ICD-10-CM

## 2010-05-24 DIAGNOSIS — D500 Iron deficiency anemia secondary to blood loss (chronic): Secondary | ICD-10-CM | POA: Insufficient documentation

## 2010-05-24 DIAGNOSIS — N920 Excessive and frequent menstruation with regular cycle: Secondary | ICD-10-CM

## 2010-05-24 LAB — BASIC METABOLIC PANEL
Calcium: 9.4 mg/dL (ref 8.4–10.5)
Creatinine, Ser: 0.89 mg/dL (ref 0.4–1.2)
GFR calc Af Amer: >60 mL/min (ref >60)
GFR calc non Af Amer: >60 mL/min (ref >60)
Glucose, Bld: 101 mg/dL — ABNORMAL HIGH (ref 70–99)
Sodium: 137 mEq/L (ref 135–145)

## 2010-05-24 LAB — CBC
MCHC: 31.1 g/dL (ref 30.0–36.0)
Platelets: 325 10*3/uL (ref 150–400)
RDW: 13.8 % (ref 11.5–15.5)
WBC: 5.2 10*3/uL (ref 4.0–10.5)

## 2010-06-03 NOTE — Progress Notes (Signed)
[name], [name]              ACCOUNT NO.:  [id number]  MEDICAL RECORD NO.:  [id number]           PATIENT TYPE:  A  LOCATION:  [facility]                   FACILITY:  [facility]  PHYSICIAN:  Maylon Cos, CNM    DATE OF BIRTH:  April 07, 1970  DATE OF SERVICE:                                 CLINIC NOTE  The patient presents to GYN Clinic at [facility].  Reason for today's visit is results of lab work and Pap.  HISTORY OF PRESENT ILLNESS:  The patient was seen by Dr. Maryelizabeth Kaufmann approximately 2 weeks ago for evaluation of heavy periods, uterine fibroids, and abnormal Pap smear evaluation.  At the time of her visit, she had HPV typing done with plans to return for colposcopy if needed as well as endometrial biopsy.  She returns today for the results of that lab work, and they were discussed as followed.  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  The patient also had lab studies that showed the CBC to check her hemoglobin, her hemoglobin is 11.2 and hematocrit is 35.5.  I have discussed with the patient the need for a colposcopy related to her high-risk HPV on an ASCUS Pap and her age being 40 years old, and she is in agreement with this.  We will defer endometrial biopsy until the time of that visit, so both can be done at the same time, and results of both can also be discussed at her followup visit.  The patient is also in agreement of that as well.  ASSESSMENT: 1. Anemia. 2. 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 3. Uterine fibroids. 4. Dysfunctional uterine bleeding.  PLAN: 1. The patient should follow up at the next available appointment this     week or next week with one of the physicians for endometrial biopsy     along with colposcopy. 2. The patient is instructed to continue her daily iron use for     anemia. 3. The patient has discussed folic acid use and research of decrease     in the progression of abnormal cells on Pap smear and  smoking     cessation.  The patient verbalizes understanding of this plan and     followup.          ______________________________ Maylon Cos, CNM    SS/MEDQ  D:  04/25/2010  T:  04/26/2010  Job:  [id number]

## 2010-06-03 NOTE — Progress Notes (Signed)
NAMESAIGE, CANTON NO.:  [id number]  MEDICAL RECORD NO.:  [id number]           PATIENT TYPE:  A  LOCATION:  [facility]                   FACILITY:  [facility]  PHYSICIAN:  Elsie Lincoln, MD      DATE OF BIRTH:  Jun 22, 1970  DATE OF SERVICE:  05/09/2010                                 CLINIC NOTE  The patient is a 40 year old female, who comes for results.  The patient had a colpo and she was complaining of dysfunctional uterine bleeding. The colpo was satisfactory and showed benign pathology.  Her Pap smear was originally ASCUS with high-risk HPV given that her endocervix was negative, then we can go to Pap smears q.6 months for a year.  If these are normal, she could do a yearly Pap smears.  Endometrial biopsy showed benign proliferative endometrium associated with a small polypoid fragment.  Her ultrasound shows a central uterine segment large fibroid 1.6 x 1.5 x 1.3 with 75% submucosal.  She is scheduled for a resection of the fibroid and endometrial ablation on May 24, 2010.  She has stopped bleeding with her Lupron and so hopefully this fibroid is slightly shrinking.  She is going to have a MyoSure and a hydrothermal ablation.  The patient understands the risk of the procedures, bleeding, infection, damage to the back of the uterus and a burn in the uterus. The patient understands and verbalizes understanding of the need for followup Pap smears until we see her on May 24, 2010, for her procedure.          ______________________________ Elsie Lincoln, MD    KL/MEDQ  D:  05/09/2010  T:  05/10/2010  Job:  [id number]

## 2010-06-14 ENCOUNTER — Ambulatory Visit (INDEPENDENT_AMBULATORY_CARE_PROVIDER_SITE_OTHER): Payer: Medicaid Other | Admitting: Obstetrics & Gynecology

## 2010-06-14 DIAGNOSIS — Z803 Family history of malignant neoplasm of breast: Secondary | ICD-10-CM

## 2010-06-14 DIAGNOSIS — N939 Abnormal uterine and vaginal bleeding, unspecified: Secondary | ICD-10-CM

## 2010-06-14 DIAGNOSIS — N926 Irregular menstruation, unspecified: Secondary | ICD-10-CM

## 2010-06-14 DIAGNOSIS — D259 Leiomyoma of uterus, unspecified: Secondary | ICD-10-CM

## 2010-06-16 NOTE — Assessment & Plan Note (Signed)
[name], [name] NO.:  [id number]  MEDICAL RECORD NO.:  [id number]           PATIENT TYPE:  LOCATION:  [facility] [place]           FACILITY:  PHYSICIAN:  Elsie Lincoln, MD      DATE OF BIRTH:  12/01/1970  DATE OF SERVICE:                                 CLINIC NOTE  HISTORY:  The patient is a 40 year old female presents 3 weeks after fibroid resection with MyoSure and hydrothermal ablation.  The patient has had some discharge and this turns slightly red, but no major bleeding.  She is satisfied with her procedure.  We are able to get the majority of the fibroid out and hopefully ablated the rest of that.  The pathology is negative from the procedure.  The patient is to come back in early August for repeat Pap smear.  The patient has a history of ASCUS and high-risk HPV with negative biopsy and ECC.  The patient has a history of first-degree relatives with breast cancer premenopausal.  She rolls in to get yearly MRIs and mammograms that should be done within 3 months of each other.  The patient was counseled on this.  The patient is going to try to get her mother tested for BCRA gene to hopefully if she is positive and then we can work on getting her tested.  The patient understands the gravity of the situation.  PHYSICAL EXAMINATION:  VITAL SIGNS:  Pulse 57, blood pressure 123/83, weight 214, height 67 inches. GENERAL:  Well-nourished, well-developed, in no apparent distress. HEENT:  Normocephalic, atraumatic. CHEST:  Normal movement. ABDOMEN:  Soft, nontender. GENTA LIA:  Tanner V.  Vagina pink, normal rugae.  Scant discharge in the vault.  Cervix, closed, nontender.  Uterus, retroverted, nontender. Adnexa, no masses, nontender.  ASSESSMENT/PLAN:  A 40 year old female status post ablation with good results.  1. Patient not sexually active, but can resume sexual activity she     would like to. 2. Pap smear in August. 3. Follow up on bracket  testing with mother. 4. Breast MRI.          ______________________________ Elsie Lincoln, MD    KL/MEDQ  D:  06/14/2010  T:  06/15/2010  Job:  [id number]

## 2010-06-24 NOTE — Op Note (Signed)
  NAMETRUC, WINFREE              ACCOUNT NO.:  [id number]  MEDICAL RECORD NO.:  [id number]        PATIENT TYPE:  WAMB  LOCATION:                                [facility]  PHYSICIAN:  Lesly Dukes, M.D. DATE OF BIRTH:  05/23/1970  DATE OF PROCEDURE:  05/24/2010 DATE OF DISCHARGE:                              OPERATIVE REPORT   PREOPERATIVE DIAGNOSIS:  A 40 year old female with submucosal fibroid, menorrhagia causing anemia.  POSTOPERATIVE DIAGNOSIS:  A 40 year old female with submucosal fibroid, menorrhagia causing anemia.  PROCEDURE:  D and C, MyoSure, fibroid resection, hydrothermal ablation.  SURGEON:  Lesly Dukes, MD.  ANESTHESIA:  General.  SPECIMEN:  Endometrial curettings to pathology.  ESTIMATED BLOOD LOSS:  Minimal.  COMPLICATIONS:  None.  DESCRIPTION OF PROCEDURE:  After informed consent was obtained, the patient was taken to the operating room and general anesthesia was induced.  The patient was placed in dorsal lithotomy position and prepared with a normal sterile fashion.  The bladder was emptied.  SCDs were on lower extremities.  Bimanual exam was performed.  Retroverted uterus was confirmed.  A bivalve speculum was placed into the patient's vagina and cervix was brought into view.  The anterior lip of cervix was grasped with single-tooth tenaculum.  The cervical os was gently dilated to a #6 Hegar.  The operative hysteroscope was introduced into the uterus and the MyoSure instrument was introduced into the uterus. Resection of the fibroid with possible up to 50%, at that point, we were unable to get an angle of the blade that could meet the fibroid due to the fundal position of the fibroid.  The fibroid was very calcified.  It was not bleeding.  At this point, we stopped the MyoSure and we proceeded with the hydrothermal ablation.  The fibroid was less than a centimeter at this point, so hydrothermal ablation can ablate fibroids this small  that are causing bleeding.  The cervical os was further dilated to a #8 Hegar dilator.  The hydrothermal ablation was placed into the uterus.  Good cervical seal was confirmed.  Cavity integrity was confirmed. After appropriate warm-up time, a 10-minute ablation time occurred.  There was a good burn across the endometrial cavity as well as the submucosal fibroid was adequately burned.  There was appropriate cool down time and then the procedure D and C was performed and endometrial curettings was sent to pathology.  The fibroid was approximately 75% removed by the end of the case All instruments were removed from the patient's vagina.  The patient tolerated the procedure well.  Sponge, lap, instrument, and needle count correct x2 and the patient was recovered in stable condition.     Lesly Dukes, M.D.     Lora Paula  D:  05/24/2010  T:  05/25/2010  Job:  [id number]  Electronically Signed by Elsie Lincoln M.D. on 06/24/2010 11:30:05 AM

## 2010-07-08 ENCOUNTER — Other Ambulatory Visit: Payer: Self-pay | Admitting: Obstetrics & Gynecology

## 2010-07-08 DIAGNOSIS — Z803 Family history of malignant neoplasm of breast: Secondary | ICD-10-CM

## 2010-07-18 ENCOUNTER — Ambulatory Visit
Admission: RE | Admit: 2010-07-18 | Discharge: 2010-07-18 | Disposition: A | Discharge status: Home / Self Care | Payer: Medicaid Other | Source: Ambulatory Visit | Attending: Obstetrics & Gynecology | Admitting: Obstetrics & Gynecology

## 2010-07-18 DIAGNOSIS — Z803 Family history of malignant neoplasm of breast: Secondary | ICD-10-CM

## 2010-07-18 MED ORDER — GADOBENATE DIMEGLUMINE 529 MG/ML IV SOLN
20.0000 mL | Freq: Once | INTRAVENOUS | Status: AC | PRN
  Administered 2010-07-18: 20 mL via INTRAVENOUS

## 2010-12-06 ENCOUNTER — Other Ambulatory Visit (HOSPITAL_COMMUNITY)
Admission: RE | Admit: 2010-12-06 | Discharge: 2010-12-06 | Disposition: A | Discharge status: Home / Self Care | Payer: Medicaid Other | Source: Ambulatory Visit | Attending: Obstetrics & Gynecology | Admitting: Obstetrics & Gynecology

## 2010-12-06 ENCOUNTER — Ambulatory Visit (INDEPENDENT_AMBULATORY_CARE_PROVIDER_SITE_OTHER): Payer: Medicaid Other | Admitting: Obstetrics & Gynecology

## 2010-12-06 ENCOUNTER — Encounter: Payer: Self-pay | Admitting: *Deleted

## 2010-12-06 DIAGNOSIS — R87610 Atypical squamous cells of undetermined significance on cytologic smear of cervix (ASC-US): Secondary | ICD-10-CM

## 2010-12-06 DIAGNOSIS — Z1159 Encounter for screening for other viral diseases: Secondary | ICD-10-CM | POA: Insufficient documentation

## 2010-12-06 DIAGNOSIS — IMO0001 Reserved for inherently not codable concepts without codable children: Secondary | ICD-10-CM

## 2010-12-06 DIAGNOSIS — Z01419 Encounter for gynecological examination (general) (routine) without abnormal findings: Secondary | ICD-10-CM | POA: Insufficient documentation

## 2010-12-06 NOTE — Progress Notes (Signed)
  Subjective:    Patient ID: [name], female    DOB: [birth year], 40 y.o.   MRN: [id number]  HPI  Pt here for rpt pap.  Pt had ASCUS with HR HPV.  Biopsy benign.  Pt doing well after ablation and myosure.  Review of Systems  Constitutional: Negative.   Gastrointestinal: Negative.   Genitourinary: Negative.   Psychiatric/Behavioral: Negative.        Objective:   Physical Exam  Constitutional: She appears well-developed and well-nourished. No distress.  HENT:  Head: Normocephalic and atraumatic.  Eyes: Conjunctivae are normal.  Pulmonary/Chest: Effort normal.  Abdominal: Soft.  Genitourinary: Vagina normal.  Skin: Skin is warm and dry.  Psychiatric: She has a normal mood and affect.          Assessment & Plan:  40 yo female with Hx ASCUS and HPV positive (feb 2012)  1. Paps q 6 months for 1 year.  This is the first pap. 2.  Rpt pap with annual exam in 6 months.  If nml then return to yearly paps. LEGGETT,KELLY H. 3:32 PM

## 2011-01-18 ENCOUNTER — Ambulatory Visit
Admission: RE | Admit: 2011-01-18 | Discharge: 2011-01-18 | Disposition: A | Discharge status: Home / Self Care | Payer: Medicaid Other | Source: Ambulatory Visit | Attending: Obstetrics & Gynecology | Admitting: Obstetrics & Gynecology

## 2011-01-18 ENCOUNTER — Other Ambulatory Visit: Payer: Self-pay | Admitting: Obstetrics & Gynecology

## 2011-01-18 ENCOUNTER — Ambulatory Visit (INDEPENDENT_AMBULATORY_CARE_PROVIDER_SITE_OTHER): Payer: Medicaid Other | Admitting: Obstetrics & Gynecology

## 2011-01-18 ENCOUNTER — Encounter: Payer: Self-pay | Admitting: Obstetrics & Gynecology

## 2011-01-18 VITALS — BP 115/75 | HR 64 | Temp 97.5°F | Resp 16 | Ht 68.0 in | Wt 216.0 lb

## 2011-01-18 DIAGNOSIS — N949 Unspecified condition associated with female genital organs and menstrual cycle: Secondary | ICD-10-CM

## 2011-01-18 DIAGNOSIS — Z803 Family history of malignant neoplasm of breast: Secondary | ICD-10-CM

## 2011-01-18 DIAGNOSIS — R102 Pelvic and perineal pain: Secondary | ICD-10-CM

## 2011-01-18 NOTE — Progress Notes (Signed)
  Subjective:    Patient ID: [name], female    DOB: Aug 05, 1970, 40 y.o.   MRN: [id number]  HPI Pt ahs been having right sided pelvic pain for 3 weeks.  Intense at times.  No association with bowel mvmt or urination.  No vaginal bleeding.  Pt s/p ablation so not menstruating.  Pt has fam hx of breast cancer in her mother and sister.  Mother declined BRCA testing.  Pt to talk to her mother again.   Review of Systems  Constitutional: Negative.   Respiratory: Negative.   Cardiovascular: Negative.   Musculoskeletal: Negative.   Psychiatric/Behavioral: Negative.        Objective:   Physical Exam  Constitutional: She is oriented to person, place, and time. She appears well-developed and well-nourished. No distress.  HENT:  Head: Normocephalic and atraumatic.  Eyes: Conjunctivae are normal.  Neck: Neck supple.  Cardiovascular: Normal rate and regular rhythm.   Pulmonary/Chest: Breath sounds normal.  Abdominal: Soft.  Genitourinary: Vagina normal and uterus normal. No vaginal discharge found.       Pt in rt lower quadrant.  Pain lower towards the mons.  Right ovary palpated but  Did not feel enlarged and tenderness was less in this area.  Uterus nml.  No adnexal mass or pain on the left.  Neurological: She is alert and oriented to person, place, and time.  Skin: Skin is warm and dry.  Psychiatric: She has a normal mood and affect.          Assessment & Plan:   40 yo female with RLQ pain x3 weeks  1.  TVUS 2.  UA 3.  BRCA testing 4.  RTC 2 weeks

## 2011-01-18 NOTE — Progress Notes (Signed)
Addended by: Granville Lewis on: 01/18/2011 02:27 PM   Modules accepted: Orders

## 2011-02-08 ENCOUNTER — Ambulatory Visit: Payer: Medicaid Other | Admitting: Obstetrics & Gynecology

## 2011-02-15 ENCOUNTER — Ambulatory Visit (INDEPENDENT_AMBULATORY_CARE_PROVIDER_SITE_OTHER): Payer: Medicaid Other | Admitting: Obstetrics & Gynecology

## 2011-02-15 ENCOUNTER — Encounter: Payer: Self-pay | Admitting: Obstetrics & Gynecology

## 2011-02-15 VITALS — BP 129/75 | HR 64 | Temp 97.0°F | Ht 68.0 in | Wt 214.0 lb

## 2011-02-15 DIAGNOSIS — N949 Unspecified condition associated with female genital organs and menstrual cycle: Secondary | ICD-10-CM

## 2011-02-15 DIAGNOSIS — R102 Pelvic and perineal pain: Secondary | ICD-10-CM | POA: Insufficient documentation

## 2011-02-15 NOTE — Progress Notes (Signed)
  Subjective:    Patient ID: [name], female    DOB: 06-Feb-1971, 40 y.o.   MRN: [id number]  HPI Pt still having rt sided pelvic pain.  Worse at night.  Sometimes feels like she is going to ovulate (pt s/p ablation).  BM daily and nml consistency.  No probs with urination.  Pt unsure if she still has appendix.  Pt has no history of ovarian cysts, STDs, or endometriosis.  Pt does have small fibroids.   Review of Systems  Constitutional: Negative.   Respiratory: Negative.   Cardiovascular: Negative.   Gastrointestinal: Negative.   Genitourinary: Positive for pelvic pain. Negative for vaginal bleeding and vaginal discharge.       Objective:   Physical Exam  Constitutional: She is oriented to person, place, and time. She appears well-developed and well-nourished.  HENT:  Head: Normocephalic and atraumatic.  Pulmonary/Chest: Effort normal.  Abdominal: Soft. She exhibits no distension and no mass. There is tenderness. There is no rebound and no guarding.       pai in RLQ  Neurological: She is alert and oriented to person, place, and time.  Skin: Skin is warm and dry.  Psychiatric: She has a normal mood and affect.          Assessment & Plan:  40 yo female with rt pelvic pain.  1-nml [place] except for small fibroids 2-CT of pelvis to look at rest of anatomy 3-Pt declined OCPs to see if pain is ovulatory 4-RTC in one month.  Pt does not want diagnostic laparoscopy at this time.

## 2011-02-21 ENCOUNTER — Ambulatory Visit
Admission: RE | Admit: 2011-02-21 | Discharge: 2011-02-21 | Disposition: A | Discharge status: Home / Self Care | Payer: Medicaid Other | Source: Ambulatory Visit | Attending: Obstetrics & Gynecology | Admitting: Obstetrics & Gynecology

## 2011-02-21 DIAGNOSIS — R102 Pelvic and perineal pain: Secondary | ICD-10-CM

## 2011-02-21 MED ORDER — IOHEXOL 300 MG/ML  SOLN
125.0000 mL | Freq: Once | INTRAMUSCULAR | Status: AC | PRN
  Administered 2011-02-21: 125 mL via INTRAVENOUS

## 2011-02-22 ENCOUNTER — Encounter: Payer: Self-pay | Admitting: Gastroenterology

## 2011-02-22 ENCOUNTER — Telehealth: Payer: Self-pay | Admitting: *Deleted

## 2011-02-22 DIAGNOSIS — K5790 Diverticulosis of intestine, part unspecified, without perforation or abscess without bleeding: Secondary | ICD-10-CM

## 2011-02-22 NOTE — Telephone Encounter (Signed)
Pt is scheduled with [facility] 03/21/11 at 10:30 with Dr. Christella Hartigan.  Pt notified

## 2011-02-24 ENCOUNTER — Telehealth: Payer: Self-pay | Admitting: *Deleted

## 2011-02-24 DIAGNOSIS — R102 Pelvic and perineal pain: Secondary | ICD-10-CM

## 2011-02-24 MED ORDER — HYDROCODONE-ACETAMINOPHEN 5-500 MG PO TABS: 1.0000 | ORAL_TABLET | ORAL | Status: DC | PRN

## 2011-02-24 NOTE — Telephone Encounter (Signed)
Pt called requesting something stronger for her pelvic pain.  She is taking Ibuprofen every 4 hrs without relief.  Spoke with Dr. Penne Lash via phone and she ordered Vicodin 5/500mg  # 30 once every 4-6 hr prn pain with no rf.  This was sent to her pharmacy via escript.

## 2011-03-15 ENCOUNTER — Ambulatory Visit: Payer: Medicaid Other | Admitting: Obstetrics & Gynecology

## 2011-03-21 ENCOUNTER — Ambulatory Visit (INDEPENDENT_AMBULATORY_CARE_PROVIDER_SITE_OTHER): Payer: Medicaid Other | Admitting: Gastroenterology

## 2011-03-21 ENCOUNTER — Encounter: Payer: Self-pay | Admitting: Gastroenterology

## 2011-03-21 DIAGNOSIS — R109 Unspecified abdominal pain: Secondary | ICD-10-CM

## 2011-03-21 DIAGNOSIS — R933 Abnormal findings on diagnostic imaging of other parts of digestive tract: Secondary | ICD-10-CM

## 2011-03-21 MED ORDER — PEG-KCL-NACL-NASULF-NA ASC-C 100 G PO SOLR: 1.0000 | ORAL | Status: DC

## 2011-03-21 NOTE — Patient Instructions (Signed)
You will be set up for a colonoscopy. Please start taking citrucel (orange flavored) powder fiber supplement.  This may cause some bloating at first but that usually goes away. Begin with a small spoonful and work your way up to a large, heaping spoonful daily over a week.

## 2011-03-21 NOTE — Progress Notes (Signed)
HPI: This is a   very pleasant 41 year old woman who I am meeting for the first time today.   Has been having abdominal pain, right sided lower.  Had vaginal bleeding as well.  Fibroid was found.  Still having pain in right side, daily.  Can radiate to her back.    Eliminated rice in her diet, felt a bit better. cetain foods irritated it.    Overall weight stable.  No overt gi bleeding.   She had a CT scan with IV and oral contrast last month and it showed scattered colonic diverticulosis but no sign of inflammation.    Review of systems: Pertinent positive and negative review of systems were noted in the above HPI section. Complete review of systems was performed and was otherwise normal.    Past Medical History  Diagnosis Date  . Abnormal Pap smear 1/12    ascus with pos HPV  . Anemia   . Hypertension   . Diverticulosis   . Gallstones   . Glaucoma     Past Surgical History  Procedure Date  . Endometrial ablation 3/12  . Cholecystectomy 2001  . Wisdom tooth extraction     Current Outpatient Prescriptions  Medication Sig Dispense Refill  . ergocalciferol (VITAMIN D2) 50000 UNITS capsule Take 50,000 Units by mouth once a week.        . ferrous sulfate 325 (65 FE) MG tablet Take 325 mg by mouth daily with breakfast.        . folic acid (FOLVITE) 800 MCG tablet Take 400 mcg by mouth daily.        . hydrochlorothiazide (HYDRODIURIL) 25 MG tablet Take 25 mg by mouth daily.        Marland Kitchen HYDROcodone-acetaminophen (VICODIN) 5-500 MG per tablet Take 1 tablet by mouth every 4 (four) hours as needed for pain.  30 tablet  0  . ibuprofen (ADVIL,MOTRIN) 800 MG tablet Take 800 mg by mouth every 8 (eight) hours as needed.        . traMADol (ULTRAM) 50 MG tablet Take 50 mg by mouth every 6 (six) hours as needed.          Allergies as of 03/21/2011 - Review Complete 03/21/2011  Allergen Reaction Noted  . Latex Itching 12/06/2010    Family History  Problem Relation Age of Onset  .  Cancer Mother     breast  . Cancer Father     bladder  . Cancer Sister     breast  . Diabetes Mother   . Cancer Maternal Grandfather     pancreatic  . Hypertension Mother   . Hypertension Father   . Hypertension Sister     History   Social History  . Marital Status: Divorced    Spouse Name: N/A    Number of Children: N/A  . Years of Education: N/A   Occupational History  . unemployed    Social History Main Topics  . Smoking status: Former Smoker -- 0.3 packs/day for 1 years    Types: Cigarettes  . Smokeless tobacco: Never Used  . Alcohol Use: No     passive occassional wine  . Drug Use: No  . Sexually Active: No   Other Topics Concern  . Not on file   Social History Narrative  . No narrative on file       Physical Exam: BP 110/68  Pulse 69  Ht 5\' 8"  (1.727 m)  Wt 216 lb (97.977 kg)  BMI 32.84 kg/m2  Constitutional: generally well-appearing Psychiatric: alert and oriented x3 Eyes: extraocular movements intact Mouth: oral pharynx moist, no lesions Neck: supple no lymphadenopathy Cardiovascular: heart regular rate and rhythm Lungs: clear to auscultation bilaterally Abdomen: soft, nontender, nondistended, no obvious ascites, no peritoneal signs, normal bowel sounds Extremities: no lower extremity edema bilaterally Skin: no lesions on visible extremities    Assessment and plan: 41 y.o. female with  chronic right-sided low abdomen, pelvic pain  Her pain is really low in her right pelvis and it is not clear it is GI related however she does have altered bowel habits with alternating constipation, loose stools. Perhaps this is irritable bowel syndrome. We will proceed with colonoscopy at her soonest convenience in the meantime she will start fiber supplements to try to even out her bowel habits.

## 2011-03-29 ENCOUNTER — Ambulatory Visit (AMBULATORY_SURGERY_CENTER): Payer: Medicaid Other | Admitting: Gastroenterology

## 2011-03-29 ENCOUNTER — Encounter: Payer: Self-pay | Admitting: Gastroenterology

## 2011-03-29 VITALS — BP 137/92 | HR 63 | Temp 98.7°F | Resp 34 | Ht 68.0 in | Wt 216.0 lb

## 2011-03-29 DIAGNOSIS — R933 Abnormal findings on diagnostic imaging of other parts of digestive tract: Secondary | ICD-10-CM

## 2011-03-29 DIAGNOSIS — K5730 Diverticulosis of large intestine without perforation or abscess without bleeding: Secondary | ICD-10-CM

## 2011-03-29 DIAGNOSIS — R1031 Right lower quadrant pain: Secondary | ICD-10-CM

## 2011-03-29 MED ORDER — SODIUM CHLORIDE 0.9 % IV SOLN: 500.0000 mL | INTRAVENOUS | Status: DC

## 2011-03-29 NOTE — Progress Notes (Signed)
Patient did not experience any of the following events: a burn prior to discharge; a fall within the facility; wrong site/side/patient/procedure/implant event; or a hospital transfer or hospital admission upon discharge from the facility. (G8907) Patient did not have preoperative order for IV antibiotic SSI prophylaxis. (G8918)  

## 2011-03-29 NOTE — Progress Notes (Signed)
The pt tolerated the colonoscopy very well. maw 

## 2011-03-29 NOTE — Patient Instructions (Signed)
FOLLOW DISCHARGE INSTRUCTIONS (BLUE AND GREEN SHEETS). DIVERTICULOSIS AND HIGH FIBER INFORMATION SHEETS GIVEN.

## 2011-03-29 NOTE — Op Note (Signed)
[facility] [address] [place], [place]  [address]  COLONOSCOPY PROCEDURE REPORT  PATIENT:  [name], [name]  MR#:  [id number] BIRTHDATE:  07/28/1970, 40 yrs. old  GENDER:  female ENDOSCOPIST:  Rachael Fee, MD REF. BY:  Yisroel Ramming, M.D. PROCEDURE DATE:  03/29/2011 PROCEDURE:  Colonoscopy 44034 ASA CLASS:  Class II INDICATIONS:  abdominal pain, right lower quadrant MEDICATIONS:   Fentanyl 75 mcg IV, These medications were titrated to patient response per physician's verbal order, Versed 7 mg IV  DESCRIPTION OF PROCEDURE:   After the risks benefits and alternatives of the procedure were thoroughly explained, informed consent was obtained.  Digital rectal exam was performed and revealed no rectal masses.   The [id number] endoscope was introduced through the anus and advanced to the cecum, which was identified by both the appendix and ileocecal valve, without limitations.  The quality of the prep was good..  The instrument was then slowly withdrawn as the colon was fully examined. <<PROCEDUREIMAGES>> FINDINGS:  The terminal ileum appeared normal (see image3).  Mild diverticulosis was found throughout the colon (see image6).  This was otherwise a normal examination of the colon (see image7, image4, and image2).   Retroflexed views in the rectum revealed no abnormalities. COMPLICATIONS:  None  ENDOSCOPIC IMPRESSION: 1) Normal terminal ileum 2) Mild diverticulosis throughout the colon 3) Otherwise normal examination  RECOMMENDATIONS: Follow clinically.  No clear explanation for your pains on this examination. Please add fiber to your diet to see if that helps the right sided pains. Screening colonoscopy when you turn 52.  ______________________________ Rachael Fee, MD  n. eSIGNED:   Rachael Fee at 03/29/2011 10:37 AM  Montel Clock, [phone]

## 2011-03-30 ENCOUNTER — Telehealth: Payer: Self-pay

## 2011-03-30 NOTE — Telephone Encounter (Signed)

## 2011-07-26 ENCOUNTER — Encounter: Payer: Self-pay | Admitting: Obstetrics & Gynecology

## 2011-07-26 ENCOUNTER — Other Ambulatory Visit (HOSPITAL_COMMUNITY)
Admission: RE | Admit: 2011-07-26 | Discharge: 2011-07-26 | Disposition: A | Discharge status: Home / Self Care | Payer: Medicaid Other | Source: Ambulatory Visit | Attending: Obstetrics & Gynecology | Admitting: Obstetrics & Gynecology

## 2011-07-26 ENCOUNTER — Ambulatory Visit (INDEPENDENT_AMBULATORY_CARE_PROVIDER_SITE_OTHER): Payer: Medicaid Other | Admitting: Obstetrics & Gynecology

## 2011-07-26 VITALS — BP 127/76 | HR 72 | Temp 98.5°F | Resp 17 | Ht 68.0 in | Wt 214.0 lb

## 2011-07-26 DIAGNOSIS — Z01419 Encounter for gynecological examination (general) (routine) without abnormal findings: Secondary | ICD-10-CM | POA: Insufficient documentation

## 2011-07-26 DIAGNOSIS — N63 Unspecified lump in unspecified breast: Secondary | ICD-10-CM

## 2011-07-26 DIAGNOSIS — Z1159 Encounter for screening for other viral diseases: Secondary | ICD-10-CM | POA: Insufficient documentation

## 2011-07-26 NOTE — Progress Notes (Signed)
  Subjective:    Patient ID: [name], female    DOB: [birth year], 41 y.o.   MRN: [id number]  HPI  P t c/o breast lump, near left axilla.  Pt does routine self breast exams due to mother and sister having dx with breast cancer  Age 6 adn 41.  Pt missed last mammogram (should be on q 6 month schedule alternating breast MRI and mammogram).  Pt also missed f/u pap appt after cin-1.  Rt sided pelvic pain much better--diagnosed with IBS.  Made dietary changes.    Review of Systems  Constitutional: Negative.   Gastrointestinal: Negative.   Genitourinary: Negative.   Psychiatric/Behavioral: Negative.        Objective:   Physical Exam  Constitutional: She appears well-developed and well-nourished.  Pulmonary/Chest: Effort normal.  Abdominal: Soft. She exhibits no distension and no mass. There is no tenderness. There is no rebound and no guarding.  Genitourinary: Vagina normal. No vaginal discharge found.       Breast exam--small lump deep in breast lear axilla (left).  Only can feel when pt is upright.          Assessment & Plan:  Rpt pap today--can go to yeary screening if nml Diagnostic mammo / [place] Beast MRI in 6 months.

## 2011-07-26 NOTE — Progress Notes (Signed)
Addended by: Granville Lewis on: 07/26/2011 11:41 AM   Modules accepted: Orders

## 2011-08-01 ENCOUNTER — Ambulatory Visit
Admission: RE | Admit: 2011-08-01 | Discharge: 2011-08-01 | Disposition: A | Discharge status: Home / Self Care | Payer: Medicaid Other | Source: Ambulatory Visit | Attending: Obstetrics & Gynecology | Admitting: Obstetrics & Gynecology

## 2011-08-01 DIAGNOSIS — N63 Unspecified lump in unspecified breast: Secondary | ICD-10-CM

## 2012-08-27 ENCOUNTER — Ambulatory Visit (INDEPENDENT_AMBULATORY_CARE_PROVIDER_SITE_OTHER): Payer: Medicaid Other | Admitting: Obstetrics & Gynecology

## 2012-08-27 ENCOUNTER — Encounter: Payer: Self-pay | Admitting: Obstetrics & Gynecology

## 2012-08-27 ENCOUNTER — Other Ambulatory Visit (HOSPITAL_COMMUNITY)
Admission: RE | Admit: 2012-08-27 | Discharge: 2012-08-27 | Disposition: A | Discharge status: Home / Self Care | Payer: Medicaid Other | Source: Ambulatory Visit | Attending: Obstetrics & Gynecology | Admitting: Obstetrics & Gynecology

## 2012-08-27 VITALS — BP 125/83 | HR 78 | Resp 16 | Ht 68.0 in | Wt 205.0 lb

## 2012-08-27 DIAGNOSIS — Z01419 Encounter for gynecological examination (general) (routine) without abnormal findings: Secondary | ICD-10-CM | POA: Insufficient documentation

## 2012-08-27 DIAGNOSIS — Z803 Family history of malignant neoplasm of breast: Secondary | ICD-10-CM

## 2012-08-27 DIAGNOSIS — Z1151 Encounter for screening for human papillomavirus (HPV): Secondary | ICD-10-CM | POA: Insufficient documentation

## 2012-08-27 NOTE — Progress Notes (Signed)
  Subjective:     [name] is a 42 y.o. female here for a routine exam.  Current complaints: none; very happy after ablation Filed Vitals:   08/27/12 1426  BP: 125/83  Pulse: 78  Resp: 16  Height: 5\' 8"  (1.727 m)  Weight: 205 lb (92.987 kg)  .  Personal health questionnaire reviewed: yes.   Gynecologic History No LMP recorded. Patient has had an ablation. Contraception: abstinence and condoms Last Pap: 2013. Results were: normal Last mammogram: 2013. Results were: normal  Obstetric History OB History   Grav Para Term Preterm Abortions TAB SAB Ect Mult Living   2 2 2       2      # Outc Date GA Lbr Len/2nd Wgt Sex Del Anes PTL Lv   1 TRM 12/91    M SVD      2 TRM 3/98    F SVD          The following portions of the patient's history were reviewed and updated as appropriate: allergies, current medications, past family history, past medical history, past social history, past surgical history and problem list.  Review of Systems A comprehensive review of systems was negative.    Objective:   Filed Vitals:   08/27/12 1426  BP: 125/83  Pulse: 78  Resp: 16  Height: 5\' 8"  (1.727 m)  Weight: 205 lb (92.987 kg)      Vitals:  WNL General appearance: alert, cooperative and no distress Head: Normocephalic, without obvious abnormality, atraumatic Eyes: negative Throat: lips, mucosa, and tongue normal; teeth and gums normal Lungs: clear to auscultation bilaterally Breasts: normal appearance, no masses or tenderness, No nipple retraction or dimpling, No nipple discharge or bleeding Heart: regular rate and rhythm Abdomen: soft, non-tender; bowel sounds normal; no masses,  no organomegaly Pelvic: cervix normal in appearance, external genitalia normal, no adnexal masses or tenderness, no bladder tenderness, no cervical motion tenderness, perianal skin: no external genital warts noted, urethra without abnormality or discharge, uterus normal size, shape, and consistency and  vagina normal without discharge Extremities: no edema, redness or tenderness in the calves or thighs Skin: no lesions or rash Lymph nodes: Axillary adenopathy: none        Assessment:    Healthy female exam.    Plan:    Education reviewed: self breast exams and skin cancer screening. Contraception: abstinence. Mammogram ordered. Follow up in: 1 year. MRI breast now and mammogram in 6 months.  Should alternate q6 months due to stong family history

## 2012-09-25 ENCOUNTER — Ambulatory Visit (HOSPITAL_COMMUNITY)
Admission: RE | Admit: 2012-09-25 | Discharge: 2012-09-25 | Disposition: A | Discharge status: Home / Self Care | Payer: Medicaid Other | Source: Ambulatory Visit | Attending: Obstetrics & Gynecology | Admitting: Obstetrics & Gynecology

## 2012-09-25 DIAGNOSIS — Z803 Family history of malignant neoplasm of breast: Secondary | ICD-10-CM

## 2012-09-25 DIAGNOSIS — Z1231 Encounter for screening mammogram for malignant neoplasm of breast: Secondary | ICD-10-CM | POA: Insufficient documentation

## 2013-10-16 ENCOUNTER — Encounter: Payer: Medicaid Other | Attending: Internal Medicine | Admitting: Dietician

## 2013-10-16 ENCOUNTER — Encounter: Payer: Self-pay | Admitting: Dietician

## 2013-10-16 VITALS — Ht 68.0 in | Wt 216.7 lb

## 2013-10-16 DIAGNOSIS — Z6832 Body mass index (BMI) 32.0-32.9, adult: Secondary | ICD-10-CM | POA: Insufficient documentation

## 2013-10-16 DIAGNOSIS — Z713 Dietary counseling and surveillance: Secondary | ICD-10-CM | POA: Insufficient documentation

## 2013-10-16 DIAGNOSIS — E669 Obesity, unspecified: Secondary | ICD-10-CM | POA: Diagnosis not present

## 2013-10-16 NOTE — Patient Instructions (Addendum)
-  Practice portion control  -Pre portion snacks and put the bag or box away  -Keep healthy snacks on hand  -Fruit, wheat crackers, nuts, Greek yogurt, veggies, string cheese or Babybel cheese, boiled eggs, protein bar  -Keep perishables in an insulated lunch box with an icepack   -Aim to have something every 3-5 hours  -Fill up on non-starchy vegetables  -Veggies are good raw or cooked, fresh or frozen  -Avoid adding salt to foods  -Scale back little by little  -Use herbs and spices, garlic and onion  -Work on drinking more water  -Aim for 64 oz per day  -Watch portions of juice   -Keep walking!

## 2013-10-16 NOTE — Progress Notes (Signed)
  Medical Nutrition Therapy:  Appt start time: 1400 end time:  1500.   Assessment:  Primary concerns today: "[name]" was referred to [facility] for obesity. 3 months ago she found out she has hypothyroidism. Since being treated, she has noticed improved energy and no longer gaining weight. She reports she normally fluctuates between 200-210 lbs. Feels best at 185 lbs. She currently has a lot of family stress and a hectic schedule. Her dad has been in the hospital for 6 weeks. She works from home. She reports sensitivity to certain textures. Lives with her son and daughter.   Preferred Learning Style:   No preference indicated   Learning Readiness:  Ready   MEDICATIONS: see list   DIETARY INTAKE:  Sporadic eating pattern. Avoided foods include grits, fish, steak, almonds.    Beverages: juice, some water  Usual physical activity: walking 2 miles almost every day  Estimated energy needs: 1600-1800 calories  Progress Towards Goal(s):  In progress.   Nutritional Diagnosis:  [place]-3.4 Unintentional weight gain As related to history of hypothyroidism, excessive energy intake, and inappropriate food choices.  As evidenced by recent weight gain per patient report.    Intervention:  Nutrition counseling provided. -Practice portion control  -Pre portion snacks and put the bag or box away -Keep healthy snacks on hand  -Fruit, wheat crackers, nuts, Greek yogurt, veggies, string cheese or Babybel cheese, boiled eggs, protein bar  -Keep perishables in an insulated lunch box with an icepack   -Aim to have something every 3-5 hours -Fill up on non-starchy vegetables  -Veggies are good raw or cooked, fresh or frozen -Avoid adding salt to foods  -Scale back little by little  -Use herbs and spices, garlic and onion -Work on drinking more water  -Aim for 64 oz per day  -Watch portions of juice  -Keep walking!  Teaching Method Utilized:  Visual Auditory Hands on  Handouts given during visit  include:  15 g CHO + protein snacks  Low sodium flavoring options  Barriers to learning/adherence to lifestyle change: stress and schedule  Demonstrated degree of understanding via:  Teach Back   Monitoring/Evaluation:  Dietary intake, exercise, stress management, and body weight in 2 month(s).

## 2013-11-24 ENCOUNTER — Ambulatory Visit: Payer: Medicaid Other | Admitting: Obstetrics & Gynecology

## 2013-12-01 ENCOUNTER — Encounter: Payer: Self-pay | Admitting: Obstetrics & Gynecology

## 2013-12-01 ENCOUNTER — Ambulatory Visit (INDEPENDENT_AMBULATORY_CARE_PROVIDER_SITE_OTHER): Payer: Medicaid Other | Admitting: Obstetrics & Gynecology

## 2013-12-01 VITALS — BP 138/90 | HR 87 | Resp 16 | Ht 68.0 in | Wt 217.0 lb

## 2013-12-01 DIAGNOSIS — Z803 Family history of malignant neoplasm of breast: Secondary | ICD-10-CM

## 2013-12-01 DIAGNOSIS — Z01419 Encounter for gynecological examination (general) (routine) without abnormal findings: Secondary | ICD-10-CM

## 2013-12-01 NOTE — Progress Notes (Signed)
  Subjective:     [name] is a 43 y.o. female here for a routine exam.  Current complaints: does not like breast MRI.  Would like to only do the 3D Mammo.  Pt also can't get BRCA tests from her mother.  She is going to call her oncologist.  Her sister was tested and those results are being sent to me..  Pt recently accepted into law school.   Gynecologic History No LMP recorded. Patient has had an ablation. Contraception: abstinence Last Pap: 2014. Results were: normal and negative cotesting Last mammogram: 2014. Results were: normal  Obstetric History OB History  Gravida Para Term Preterm AB SAB TAB Ectopic Multiple Living  $Remov'2 2 2       2    'FhmXHo$ # Outcome Date GA Lbr Len/2nd Weight Sex Delivery Anes PTL Lv  2 TRM 06/04/96    F SVD     1 TRM 02/15/90    M SVD          The following portions of the patient's history were reviewed and updated as appropriate: allergies, current medications, past family history, past medical history, past social history, past surgical history and problem list.  Review of Systems Pertinent items are noted in HPI.    Objective:      Filed Vitals:   12/01/13 1507  BP: 138/90  Pulse: 87  Resp: 16  Height: $Remove'5\' 8"'hZkyDRf$  (1.727 m)  Weight: 217 lb (98.431 kg)   Vitals:  WNL General appearance: alert, cooperative and no distress Head: Normocephalic, without obvious abnormality, atraumatic Eyes: negative Throat: lips, mucosa, and tongue normal; teeth and gums normal Lungs: clear to auscultation bilaterally Breasts: normal appearance, no masses or tenderness, Nipples are inverted and have been her whole life, No nipple discharge or bleeding Heart: regular rate and rhythm Abdomen: soft, non-tender; bowel sounds normal; no masses,  no organomegaly  Pelvic:  External Genitalia:  Tanner V, no lesion Urethra:  No prolapse Vagina:  Pink, normal rugae, no blood or discharge Cervix:  No CMT, no lesion Uterus:  Normal size and contour, non tender Adnexa:   Normal, no masses, non tender Rectovaginal Septum:  Non tender, no masses  Extremities: no edema, redness or tenderness in the calves or thighs Skin: no lesions or rash Lymph nodes: Axillary adenopathy: none         Assessment:    Healthy female exam.    Plan:    Education reviewed: self breast exams. Contraception: abstinence. Mammogram ordered. Follow up in: 1 year. Obtain mother's BRCA testing.

## 2013-12-16 ENCOUNTER — Ambulatory Visit: Payer: Medicaid Other | Admitting: Dietician

## 2014-01-12 ENCOUNTER — Encounter: Payer: Self-pay | Admitting: Obstetrics & Gynecology

## 2014-03-30 ENCOUNTER — Emergency Department (HOSPITAL_COMMUNITY): Payer: Medicaid Other

## 2014-03-30 ENCOUNTER — Emergency Department (HOSPITAL_COMMUNITY)
Admission: EM | Admit: 2014-03-30 | Discharge: 2014-03-30 | Disposition: A | Discharge status: Home / Self Care | Payer: Medicaid Other | Attending: Emergency Medicine | Admitting: Emergency Medicine

## 2014-03-30 ENCOUNTER — Encounter (HOSPITAL_COMMUNITY): Payer: Self-pay | Admitting: Emergency Medicine

## 2014-03-30 DIAGNOSIS — Z87891 Personal history of nicotine dependence: Secondary | ICD-10-CM | POA: Diagnosis not present

## 2014-03-30 DIAGNOSIS — Z9049 Acquired absence of other specified parts of digestive tract: Secondary | ICD-10-CM | POA: Insufficient documentation

## 2014-03-30 DIAGNOSIS — D72829 Elevated white blood cell count, unspecified: Secondary | ICD-10-CM | POA: Diagnosis not present

## 2014-03-30 DIAGNOSIS — Z3202 Encounter for pregnancy test, result negative: Secondary | ICD-10-CM | POA: Insufficient documentation

## 2014-03-30 DIAGNOSIS — Z91040 Latex allergy status: Secondary | ICD-10-CM | POA: Insufficient documentation

## 2014-03-30 DIAGNOSIS — K2970 Gastritis, unspecified, without bleeding: Secondary | ICD-10-CM

## 2014-03-30 DIAGNOSIS — I10 Essential (primary) hypertension: Secondary | ICD-10-CM | POA: Insufficient documentation

## 2014-03-30 DIAGNOSIS — R109 Unspecified abdominal pain: Secondary | ICD-10-CM

## 2014-03-30 DIAGNOSIS — R1032 Left lower quadrant pain: Secondary | ICD-10-CM

## 2014-03-30 DIAGNOSIS — K2980 Duodenitis without bleeding: Secondary | ICD-10-CM

## 2014-03-30 DIAGNOSIS — Z79899 Other long term (current) drug therapy: Secondary | ICD-10-CM | POA: Insufficient documentation

## 2014-03-30 DIAGNOSIS — Z8669 Personal history of other diseases of the nervous system and sense organs: Secondary | ICD-10-CM | POA: Diagnosis not present

## 2014-03-30 DIAGNOSIS — E079 Disorder of thyroid, unspecified: Secondary | ICD-10-CM | POA: Diagnosis not present

## 2014-03-30 LAB — CBC WITH DIFFERENTIAL/PLATELET
BASOS PCT: 0 % (ref 0–1)
Basophils Absolute: 0 10*3/uL (ref 0.0–0.1)
EOS ABS: 0 10*3/uL (ref 0.0–0.7)
Eosinophils Relative: 0 % (ref 0–5)
HCT: 41.3 % (ref 36.0–46.0)
HEMOGLOBIN: 14.1 g/dL (ref 12.0–15.0)
Lymphocytes Relative: 3 % — ABNORMAL LOW (ref 12–46)
Lymphs Abs: 0.6 10*3/uL — ABNORMAL LOW (ref 0.7–4.0)
MCH: 29.4 pg (ref 26.0–34.0)
MCHC: 34.1 g/dL (ref 30.0–36.0)
MCV: 86 fL (ref 78.0–100.0)
MONO ABS: 1.6 10*3/uL — AB (ref 0.1–1.0)
Monocytes Relative: 8 % (ref 3–12)
Neutro Abs: 17.6 10*3/uL — ABNORMAL HIGH (ref 1.7–7.7)
Neutrophils Relative %: 89 % — ABNORMAL HIGH (ref 43–77)
PLATELETS: 250 10*3/uL (ref 150–400)
RBC: 4.8 MIL/uL (ref 3.87–5.11)
RDW: 13.4 % (ref 11.5–15.5)
WBC: 19.9 10*3/uL — ABNORMAL HIGH (ref 4.0–10.5)

## 2014-03-30 LAB — BASIC METABOLIC PANEL
Anion gap: 9 (ref 5–15)
BUN: <5 mg/dL — ABNORMAL LOW (ref 6–23)
CHLORIDE: 102 meq/L (ref 96–112)
CO2: 23 mmol/L (ref 19–32)
Calcium: 9.3 mg/dL (ref 8.4–10.5)
Creatinine, Ser: 0.85 mg/dL (ref 0.50–1.10)
GFR calc Af Amer: >90 mL/min (ref >90)
GFR calc non Af Amer: 84 mL/min — ABNORMAL LOW (ref >90)
Glucose, Bld: 137 mg/dL — ABNORMAL HIGH (ref 70–99)
POTASSIUM: 3.2 mmol/L — AB (ref 3.5–5.1)
Sodium: 134 mmol/L — ABNORMAL LOW (ref 135–145)

## 2014-03-30 LAB — URINALYSIS, ROUTINE W REFLEX MICROSCOPIC
Bilirubin Urine: NEGATIVE
GLUCOSE, UA: NEGATIVE mg/dL
Hgb urine dipstick: NEGATIVE
KETONES UR: 15 mg/dL — AB
LEUKOCYTES UA: NEGATIVE
Nitrite: NEGATIVE
PROTEIN: NEGATIVE mg/dL
Specific Gravity, Urine: 1.016 (ref 1.005–1.030)
Urobilinogen, UA: 1 mg/dL (ref 0.0–1.0)
pH: 7.5 (ref 5.0–8.0)

## 2014-03-30 LAB — POC URINE PREG, ED
PREG TEST UR: NEGATIVE
Preg Test, Ur: NEGATIVE

## 2014-03-30 MED ORDER — ONDANSETRON HCL 4 MG/2ML IJ SOLN
4.0000 mg | Freq: Once | INTRAMUSCULAR | Status: AC
  Administered 2014-03-30: 4 mg via INTRAVENOUS
  Filled 2014-03-30: qty 2

## 2014-03-30 MED ORDER — OMEPRAZOLE 20 MG PO CPDR: 20.0000 mg | DELAYED_RELEASE_CAPSULE | Freq: Every day | ORAL | Status: DC

## 2014-03-30 MED ORDER — MORPHINE SULFATE 4 MG/ML IJ SOLN
4.0000 mg | Freq: Once | INTRAMUSCULAR | Status: AC
  Administered 2014-03-30: 4 mg via INTRAVENOUS
  Filled 2014-03-30: qty 1

## 2014-03-30 MED ORDER — IOHEXOL 300 MG/ML  SOLN
100.0000 mL | Freq: Once | INTRAMUSCULAR | Status: AC | PRN
  Administered 2014-03-30: 100 mL via INTRAVENOUS

## 2014-03-30 MED ORDER — SODIUM CHLORIDE 0.9 % IV BOLUS (SEPSIS)
500.0000 mL | Freq: Once | INTRAVENOUS | Status: AC
  Administered 2014-03-30: 500 mL via INTRAVENOUS

## 2014-03-30 MED ORDER — IOHEXOL 300 MG/ML  SOLN
25.0000 mL | Freq: Once | INTRAMUSCULAR | Status: AC | PRN
  Administered 2014-03-30: 25 mL via ORAL

## 2014-03-30 MED ORDER — OXYCODONE-ACETAMINOPHEN 5-325 MG PO TABS: 1.0000 | ORAL_TABLET | ORAL | Status: DC | PRN

## 2014-03-30 MED ORDER — MORPHINE SULFATE 4 MG/ML IJ SOLN
4.0000 mg | Freq: Once | INTRAMUSCULAR | Status: AC
Start: 2014-03-30 — End: 2014-03-30
  Administered 2014-03-30: 4 mg via INTRAVENOUS
  Filled 2014-03-30: qty 1

## 2014-03-30 NOTE — ED Notes (Signed)
Pt c/o sudden onset abd pain/vomiting last night at 2300. Pt denies any diarrhea/fever or other sx.

## 2014-03-30 NOTE — ED Provider Notes (Signed)
CSN: [id number]     Arrival date & time 03/30/14  4818 History   First MD Initiated Contact with Patient 03/30/14 0507     Chief Complaint  Patient presents with  . Abdominal Pain  . Emesis   Patient is a 44 y.o. female presenting with abdominal pain and vomiting. The history is provided by the patient.  Abdominal Pain Pain location:  LLQ and RLQ Pain quality: sharp   Pain radiation: back. Pain severity:  Severe Onset quality:  Sudden Duration:  6 hours Timing:  Constant Progression:  Worsening Chronicity:  New Relieved by:  None tried Worsened by:  Palpation Associated symptoms: nausea and vomiting   Associated symptoms: no chest pain, no cough, no diarrhea, no dysuria, no fever and no vaginal bleeding   Emesis Associated symptoms: abdominal pain   Associated symptoms: no diarrhea     Past Medical History  Diagnosis Date  . Abnormal Pap smear 1/12    ascus with pos HPV  . Anemia   . Hypertension   . Diverticulosis   . Gallstones   . Glaucoma   . Thyroid disease    Past Surgical History  Procedure Laterality Date  . Endometrial ablation  3/12  . Cholecystectomy  2001  . Wisdom tooth extraction     Family History  Problem Relation Age of Onset  . Cancer Mother     breast  . Diabetes Mother   . Hypertension Mother   . Breast cancer Mother   . Cancer Father     bladder  . Hypertension Father   . Cancer Sister     breast  . Cancer Maternal Grandfather     pancreatic  . Hypertension Sister    History  Substance Use Topics  . Smoking status: Former Smoker -- 0.30 packs/day for 1 years    Types: Cigarettes  . Smokeless tobacco: Never Used  . Alcohol Use: No     Comment: passive occassional wine   OB History    Gravida Para Term Preterm AB TAB SAB Ectopic Multiple Living   2 2 2       2      Review of Systems  Constitutional: Negative for fever.  Respiratory: Negative for cough.   Cardiovascular: Negative for chest pain.  Gastrointestinal: Positive  for nausea, vomiting and abdominal pain. Negative for diarrhea and blood in stool.  Genitourinary: Negative for dysuria and vaginal bleeding.  All other systems reviewed and are negative.     Allergies  Latex  Home Medications   Prior to Admission medications   Medication Sig Start Date End Date Taking? Authorizing Provider  Fiber CHEW Chew by mouth daily.    Historical Provider, MD  gabapentin (NEURONTIN) 300 MG capsule Take 300 mg by mouth 3 (three) times daily.    Historical Provider, MD  hydrochlorothiazide (HYDRODIURIL) 25 MG tablet Take 25 mg by mouth daily.    Historical Provider, MD  levothyroxine (SYNTHROID, LEVOTHROID) 25 MCG tablet Take 25 mcg by mouth daily before breakfast.    Historical Provider, MD  pravastatin (PRAVACHOL) 10 MG tablet Take 10 mg by mouth daily.    Historical Provider, MD  zolpidem (AMBIEN) 10 MG tablet Take 10 mg by mouth at bedtime as needed for sleep.    Historical Provider, MD   BP 144/82 mmHg  Pulse 82  Temp(Src) 98.6 F (37 C) (Oral)  Resp 17  Ht 5\' 8"  (1.727 m)  Wt 215 lb (97.523 kg)  BMI 32.70 kg/m2  SpO2  100% Physical Exam CONSTITUTIONAL: Well developed/well nourished, uncomfortable appearing HEAD: Normocephalic/atraumatic EYES: EOMI/PERRL ENMT: Mucous membranes moist NECK: supple no meningeal signs SPINE/BACK:entire spine nontender CV: S1/S2 noted, no murmurs/rubs/gallops noted LUNGS: Lungs are clear to auscultation bilaterally, no apparent distress ABDOMEN: soft, moderate LLQ/RLQ tenderness, no rebound or guarding, bowel sounds noted throughout abdomen GU:no cva tenderness NEURO: Pt is awake/alert/appropriate, moves all extremitiesx4.  No facial droop.   EXTREMITIES: pulses normal/equal, full ROM SKIN: warm, color normal PSYCH: no abnormalities of mood noted, alert and oriented to situation  ED Course  Procedures   6:43 AM Pt localizing pain to LLQ with leukocytosis Concern for diverticulitis Will order CT imaging  8:21  AM Pt improved We discussed CT results (she denies NSAID use) She will start prilosec and percocet also given for pain Discussed abnormal uterine findings and discussed need for outpatient pelvic US this month Pt agreeable with plan  Labs Review Labs Reviewed  BASIC METABOLIC PANEL - Abnormal; Notable for the following:    Sodium 134 (*)    Potassium 3.2 (*)    Glucose, Bld 137 (*)    BUN <5 (*)    GFR calc non Af Amer 84 (*)    All other components within normal limits  CBC WITH DIFFERENTIAL - Abnormal; Notable for the following:    WBC 19.9 (*)    Neutrophils Relative % 89 (*)    Neutro Abs 17.6 (*)    Lymphocytes Relative 3 (*)    Lymphs Abs 0.6 (*)    Monocytes Absolute 1.6 (*)    All other components within normal limits  URINALYSIS, ROUTINE W REFLEX MICROSCOPIC - Abnormal; Notable for the following:    APPearance CLOUDY (*)    Ketones, ur 15 (*)    All other components within normal limits  POC URINE PREG, ED  POC URINE PREG, ED    Imaging Review Ct Abdomen Pelvis W Contrast  03/30/2014   ADDENDUM REPORT: 03/30/2014 08:02  ADDENDUM: To a somewhat lesser degree, there is thickening of the entire wall of the duodenum.  These results were called by telephone at the time of interpretation on 03/30/2014 at 8:02 am to Dr. Ripley Fraise , who verbally acknowledged these results.   Electronically Signed   By: Lowella Grip M.D.   On: 03/30/2014 08:02   03/30/2014   CLINICAL DATA:  One day history of abdominal pain and vomiting  EXAM: CT ABDOMEN AND PELVIS WITH CONTRAST  TECHNIQUE: Multidetector CT imaging of the abdomen and pelvis was performed using the standard protocol following bolus administration of intravenous contrast. Oral contrast was also administered.  CONTRAST:  179mL OMNIPAQUE IOHEXOL 300 MG/ML  SOLN  COMPARISON:  February 21, 2011  FINDINGS: There is subsegmental atelectasis in the lung bases. Lung bases otherwise are clear.  Liver is prominent, measuring 17.6 cm  in length. No focal liver lesions are identified. Gallbladder is absent. There is no appreciable biliary duct dilatation.  Spleen, pancreas, and adrenals appear normal. Kidneys bilaterally show no appreciable mass or hydronephrosis on either side. There is fetal lobulation in each kidney, an anatomic variant. There is no renal or ureteral calculus on either side.  In the pelvis, the urinary bladder is midline with normal wall thickness. The uterus has a somewhat inhomogeneous appearance. Endometrium appear somewhat irregular in contour. Apart from the uterus, there is no appreciable pelvic mass. There is rather minimal free fluid in the cul-de-sac region. Appendix appears normal.  There is a minimal ventral hernia containing  only fat.  There is no bowel obstruction. No free air or portal venous air. There is wall thickening in the region of the pylorus and proximal duodenum. No contrast extravasation or fistula is seen in this area.  There is no appreciable adenopathy or abscess in the abdomen or pelvis. There is no demonstrable abdominal aortic aneurysm. There are no blastic or lytic bone lesions.  IMPRESSION: There is fold thickening in the region of the pylorus and proximal duodenum. Question gastritis/ duodenitis, possibly with ulceration in this area. No obvious ulcer or fistula seen. No contrast extravasation.  No bowel obstruction.  No free air or portal venous air.  The endometrium appear somewhat irregular in contour, and the uterus appears rather inhomogeneous. These findings are felt to warrant a pelvic ultrasound to further assess. Minimal free fluid in the cul-de-sac region may be physiologic but could represent recent ovarian cyst rupture.  Gallbladder absent.  Appendix appears normal.  No abscess.  Electronically Signed: By: Lowella Grip M.D. On: 03/30/2014 07:50    Medications  morphine 4 MG/ML injection 4 mg (4 mg Intravenous Given 03/30/14 0542)  ondansetron (ZOFRAN) injection 4 mg (4 mg  Intravenous Given 03/30/14 0542)  morphine 4 MG/ML injection 4 mg (4 mg Intravenous Given 03/30/14 0607)  iohexol (OMNIPAQUE) 300 MG/ML solution 25 mL (25 mLs Oral Contrast Given 03/30/14 0704)  sodium chloride 0.9 % bolus 500 mL (500 mLs Intravenous New Bag/Given 03/30/14 0702)  iohexol (OMNIPAQUE) 300 MG/ML solution 100 mL (100 mLs Intravenous Contrast Given 03/30/14 0734)     MDM   Final diagnoses:  Left lower quadrant pain  Leukocytosis  Duodenitis  Gastritis    Nursing notes including past medical history and social history reviewed and considered in documentation Labs/vital reviewed myself and considered during evaluation     Sharyon Cable, MD 03/30/14 [phone]

## 2014-04-06 ENCOUNTER — Other Ambulatory Visit: Payer: Self-pay | Admitting: Obstetrics & Gynecology

## 2014-04-06 ENCOUNTER — Ambulatory Visit (HOSPITAL_COMMUNITY)
Admission: RE | Admit: 2014-04-06 | Discharge: 2014-04-06 | Disposition: A | Discharge status: Home / Self Care | Payer: Medicaid Other | Source: Ambulatory Visit | Attending: Obstetrics & Gynecology | Admitting: Obstetrics & Gynecology

## 2014-04-06 DIAGNOSIS — Z01419 Encounter for gynecological examination (general) (routine) without abnormal findings: Secondary | ICD-10-CM

## 2014-04-06 DIAGNOSIS — Z1231 Encounter for screening mammogram for malignant neoplasm of breast: Secondary | ICD-10-CM | POA: Diagnosis present

## 2014-04-06 DIAGNOSIS — R928 Other abnormal and inconclusive findings on diagnostic imaging of breast: Secondary | ICD-10-CM | POA: Diagnosis not present

## 2014-04-08 ENCOUNTER — Other Ambulatory Visit: Payer: Self-pay | Admitting: Obstetrics & Gynecology

## 2014-04-08 ENCOUNTER — Other Ambulatory Visit: Payer: Self-pay

## 2014-04-08 DIAGNOSIS — R928 Other abnormal and inconclusive findings on diagnostic imaging of breast: Secondary | ICD-10-CM

## 2014-04-09 ENCOUNTER — Ambulatory Visit (INDEPENDENT_AMBULATORY_CARE_PROVIDER_SITE_OTHER): Payer: Medicaid Other | Admitting: Obstetrics & Gynecology

## 2014-04-09 ENCOUNTER — Encounter: Payer: Self-pay | Admitting: Obstetrics & Gynecology

## 2014-04-09 VITALS — BP 128/83 | HR 68 | Resp 16 | Ht 68.0 in | Wt 218.0 lb

## 2014-04-09 DIAGNOSIS — K269 Duodenal ulcer, unspecified as acute or chronic, without hemorrhage or perforation: Secondary | ICD-10-CM

## 2014-04-09 DIAGNOSIS — D259 Leiomyoma of uterus, unspecified: Secondary | ICD-10-CM

## 2014-04-09 NOTE — Progress Notes (Signed)
Pt seen in ED recently for abdominal pain and vomiting.  CT showed duodenal thickening.  There was also some concern for uterine abnormalities.  Discussed case with Dr. Maryland Pink (radiologist).  Pt is s/p ablation and known to have several fibroids.  There is no other abnormality noted in the uterus.  Pt is not having any bleeding.  Endometrium is not thickened.  Pt reassured that there is no need for further imaging or treatment.  If pt begins bleeding irregularly, then will proceed with workup.  Pt has diagnostic mammogram schedule for Feb 2106.  Pap is up to date.  15 minutes spent with patient with >50% counseling.

## 2014-04-13 ENCOUNTER — Ambulatory Visit
Admission: RE | Admit: 2014-04-13 | Discharge: 2014-04-13 | Disposition: A | Discharge status: Home / Self Care | Payer: Medicaid Other | Source: Ambulatory Visit | Attending: Obstetrics & Gynecology | Admitting: Obstetrics & Gynecology

## 2014-04-13 DIAGNOSIS — R928 Other abnormal and inconclusive findings on diagnostic imaging of breast: Secondary | ICD-10-CM

## 2014-04-20 ENCOUNTER — Encounter: Payer: Self-pay | Admitting: Gastroenterology

## 2014-04-27 ENCOUNTER — Ambulatory Visit: Payer: Medicaid Other | Admitting: Gastroenterology

## 2014-05-05 ENCOUNTER — Ambulatory Visit: Payer: Medicaid Other | Admitting: Gastroenterology

## 2015-05-27 ENCOUNTER — Other Ambulatory Visit: Payer: Self-pay | Admitting: Obstetrics & Gynecology

## 2015-05-27 DIAGNOSIS — Z1231 Encounter for screening mammogram for malignant neoplasm of breast: Secondary | ICD-10-CM

## 2015-05-28 ENCOUNTER — Ambulatory Visit: Payer: Medicaid Other

## 2018-01-15 ENCOUNTER — Other Ambulatory Visit (HOSPITAL_COMMUNITY): Payer: Self-pay | Admitting: *Deleted

## 2018-01-15 DIAGNOSIS — N6320 Unspecified lump in the left breast, unspecified quadrant: Secondary | ICD-10-CM

## 2018-02-05 ENCOUNTER — Ambulatory Visit (HOSPITAL_COMMUNITY)
Admission: RE | Admit: 2018-02-05 | Discharge: 2018-02-05 | Disposition: A | Discharge status: Home / Self Care | Payer: Medicaid Other | Source: Ambulatory Visit | Attending: Obstetrics and Gynecology | Admitting: Obstetrics and Gynecology

## 2018-02-05 ENCOUNTER — Ambulatory Visit: Payer: Self-pay

## 2018-02-05 ENCOUNTER — Other Ambulatory Visit (HOSPITAL_COMMUNITY): Payer: Self-pay | Admitting: *Deleted

## 2018-02-05 ENCOUNTER — Encounter (HOSPITAL_COMMUNITY): Payer: Self-pay

## 2018-02-05 VITALS — BP 118/70 | Ht 68.0 in | Wt 234.0 lb

## 2018-02-05 DIAGNOSIS — Z1231 Encounter for screening mammogram for malignant neoplasm of breast: Secondary | ICD-10-CM

## 2018-02-05 DIAGNOSIS — Z1239 Encounter for other screening for malignant neoplasm of breast: Secondary | ICD-10-CM

## 2018-02-05 NOTE — Progress Notes (Addendum)
Complaints of a left breast lump and pain one month ago that resolved one week ago.  Pap Smear: Pap smear not completed today. Last Pap smear was 08/27/2012 at [facility] and normal with negative HPV. Per patient has no history of an abnormal Pap smear. Unable to complete Pap smear in [facility] due to patient has [facility] and not covered by [employer]. Patient scheduled an appointment for a Pap smear at the [facility] on Monday, February 18, 2018. Last Pap smear result is in Epic.  Physical exam: Breasts Breasts symmetrical. No skin abnormalities bilateral breasts. Bilateral nipple inversion that per patient is normal for her. No nipple discharge bilateral breasts. No lymphadenopathy. No lumps palpated bilateral breasts. No complaints of pain or tenderness on exam. Referred patient to the [facility] for a screening mammogram. Appointment scheduled for Tuesday, February 12, 2018 at 1510.        Pelvic/Bimanual No Pap smear completed today since patient has [facility] and not covered in [place].   Smoking History: Patient has never smoked.  Patient Navigation: Patient education provided. Access to services provided for patient through [facility] program.   Breast and Cervical Cancer Risk Assessment: Patient has a family history of her mother and older sister having breast cancer. Patient has no known genetic mutations or history of radiation treatment to the chest before age 30. Patient has no history of cervical dysplasia, immunocompromised, or DES exposure in-utero.  Risk Assessment    Risk Scores      02/05/2018   Last edited by: Armond Hang, LPN   5-year risk: 2.3 %   Lifetime risk: 22 %

## 2018-02-05 NOTE — Addendum Note (Signed)
Encounter addended by: Loletta Parish, RN on: 02/05/2018 2:02 PM  Actions taken: Sign clinical note

## 2018-02-05 NOTE — Patient Instructions (Addendum)
Explained breast self awareness with Joline [place].Unable to complete Pap smear in [facility] due to patient has [facility] and not covered by [employer]. Patient scheduled an appointment for a Pap smear at the [facility] on Monday, February 18, 2018. Referred patient to the [facility] for a screening mammogram. Appointment scheduled for Tuesday, February 12, 2018 at 1510. Patient aware of appointment and will be there. Let patient know the [facility] will follow up with her within the next couple weeks with results of mammogram by letter or phone. Destyne Goodreau Branden[place]ger verbalized understanding.  Stephanny Tsutsui, Arvil [place], RN 12:34 PM

## 2018-02-11 ENCOUNTER — Encounter (HOSPITAL_COMMUNITY): Payer: Self-pay | Admitting: *Deleted

## 2018-02-12 ENCOUNTER — Ambulatory Visit
Admission: RE | Admit: 2018-02-12 | Discharge: 2018-02-12 | Disposition: A | Discharge status: Home / Self Care | Payer: No Typology Code available for payment source | Source: Ambulatory Visit | Attending: Obstetrics and Gynecology | Admitting: Obstetrics and Gynecology

## 2018-02-12 DIAGNOSIS — Z1231 Encounter for screening mammogram for malignant neoplasm of breast: Secondary | ICD-10-CM

## 2018-02-18 ENCOUNTER — Ambulatory Visit (INDEPENDENT_AMBULATORY_CARE_PROVIDER_SITE_OTHER): Payer: Medicaid Other | Admitting: Obstetrics & Gynecology

## 2018-02-18 ENCOUNTER — Other Ambulatory Visit (HOSPITAL_COMMUNITY)
Admission: RE | Admit: 2018-02-18 | Discharge: 2018-02-18 | Disposition: A | Discharge status: Home / Self Care | Payer: Medicaid Other | Source: Ambulatory Visit | Attending: Obstetrics & Gynecology | Admitting: Obstetrics & Gynecology

## 2018-02-18 ENCOUNTER — Encounter: Payer: Self-pay | Admitting: Obstetrics & Gynecology

## 2018-02-18 VITALS — BP 147/94 | HR 93 | Resp 16 | Ht 68.0 in | Wt 237.0 lb

## 2018-02-18 DIAGNOSIS — Z01419 Encounter for gynecological examination (general) (routine) without abnormal findings: Secondary | ICD-10-CM | POA: Diagnosis present

## 2018-02-18 DIAGNOSIS — Z0000 Encounter for general adult medical examination without abnormal findings: Secondary | ICD-10-CM

## 2018-02-18 NOTE — Progress Notes (Signed)
Subjective:     [name] is a 47 y.o. female here for a routine exam.  Current complaints: none.  No bleeding after HTA.     Gynecologic History No LMP recorded. Patient has had an ablation. Contraception: abstinence Last Pap: 2014. Results were: normal Last mammogram: 2019. Results were: normal  Obstetric History OB History  Gravida Para Term Preterm AB Living  2 2 2     2   SAB TAB Ectopic Multiple Live Births          2    # Outcome Date GA Lbr Len/2nd Weight Sex Delivery Anes PTL Lv  2 Term 06/04/96    F Vag-Spont     1 Term 02/15/90    M Vag-Spont        The following portions of the patient's history were reviewed and updated as appropriate: allergies, current medications, past family history, past medical history, past social history, past surgical history and problem list.  Review of Systems Pertinent items noted in HPI and remainder of comprehensive ROS otherwise negative.    Objective:      Vitals:   02/18/18 1559  BP: (!) 147/94  Pulse: 93  Resp: 16  Weight: 237 lb (107.5 kg)  Height: 5\' 8"  (1.727 m)   Vitals:  WNL General appearance: alert, cooperative and no distress  HEENT: Normocephalic, without obvious abnormality, atraumatic Eyes: negative Throat: lips, mucosa, and tongue normal; teeth and gums normal  Respiratory: Clear to auscultation bilaterally  CV: Regular rate and rhythm; 2/6 SEM at left sternal border  Breasts:  Normal appearance, no masses or tenderness, no nipple retraction or dimpling  GI: Soft, non-tender; bowel sounds normal; no masses,  no organomegaly  GU: External Genitalia:  Tanner V, no lesion Urethra:  No prolapse   Vagina: Pink, normal rugae, no blood or discharge  Cervix: No CMT, no lesion  Uterus:  Enlarged with fibroids (known) contour, non tender  Adnexa: Normal, no masses, non tender  Musculoskeletal: No edema, redness or tenderness in the calves or thighs  Skin: No lesions or rash  Lymphatic: Axillary  adenopathy: none     Psychiatric: Normal mood and behavior        Assessment:    Healthy female exam.    Plan:   Pap with cotesting Yearly mammograms Pt getting nwe insurance and needs primary care to eval heart murmur and address HTN.

## 2018-02-20 ENCOUNTER — Encounter: Payer: Self-pay | Admitting: Obstetrics & Gynecology

## 2018-02-20 ENCOUNTER — Encounter: Payer: Self-pay | Admitting: *Deleted

## 2018-02-20 DIAGNOSIS — B977 Papillomavirus as the cause of diseases classified elsewhere: Secondary | ICD-10-CM | POA: Insufficient documentation

## 2018-02-20 LAB — CYTOLOGY - PAP
Diagnosis: NEGATIVE
HPV: DETECTED — AB

## 2020-05-21 IMAGING — MG DIGITAL SCREENING BILATERAL MAMMOGRAM WITH TOMO AND CAD
8 series · 8 of 24 positions shown · non-contrast
Comparison: Previous exam(s).

CLINICAL DATA: Screening.

EXAM:
DIGITAL SCREENING BILATERAL MAMMOGRAM WITH TOMO AND CAD

[R MLO synth-2D]
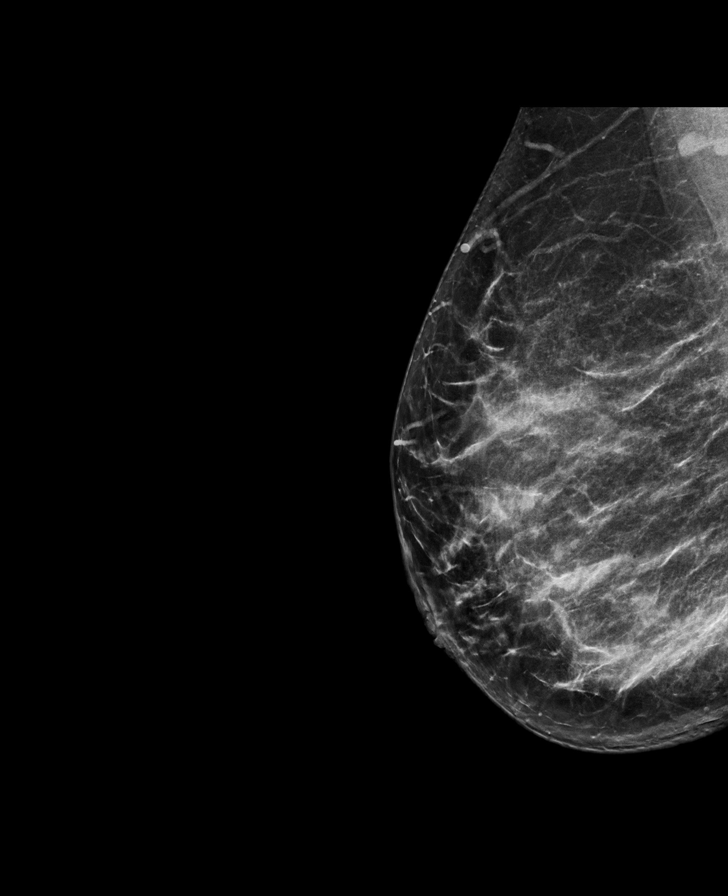

[L MLO synth-2D]
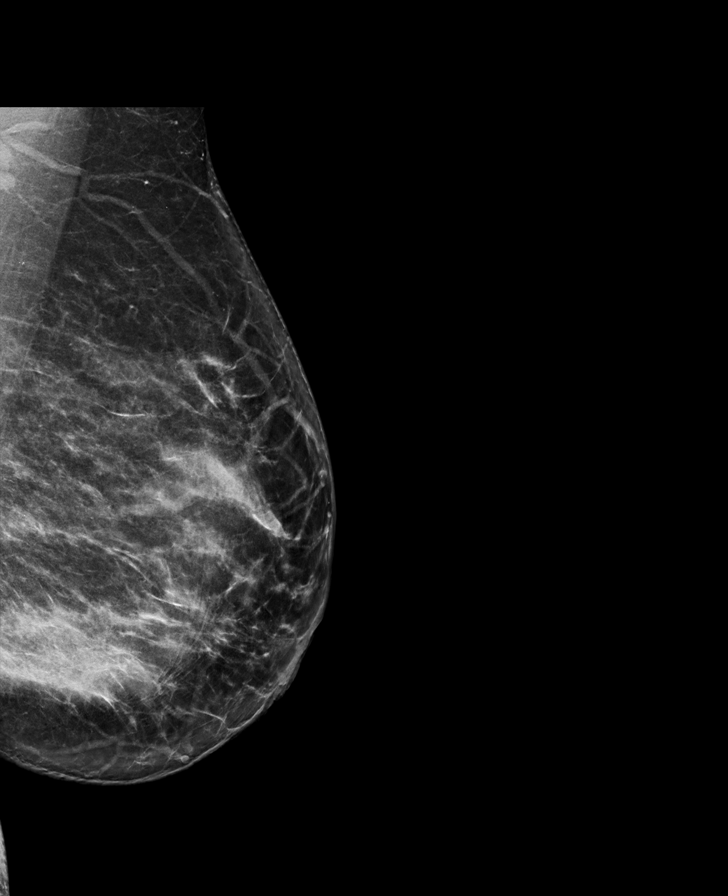

[R CC synth-2D]
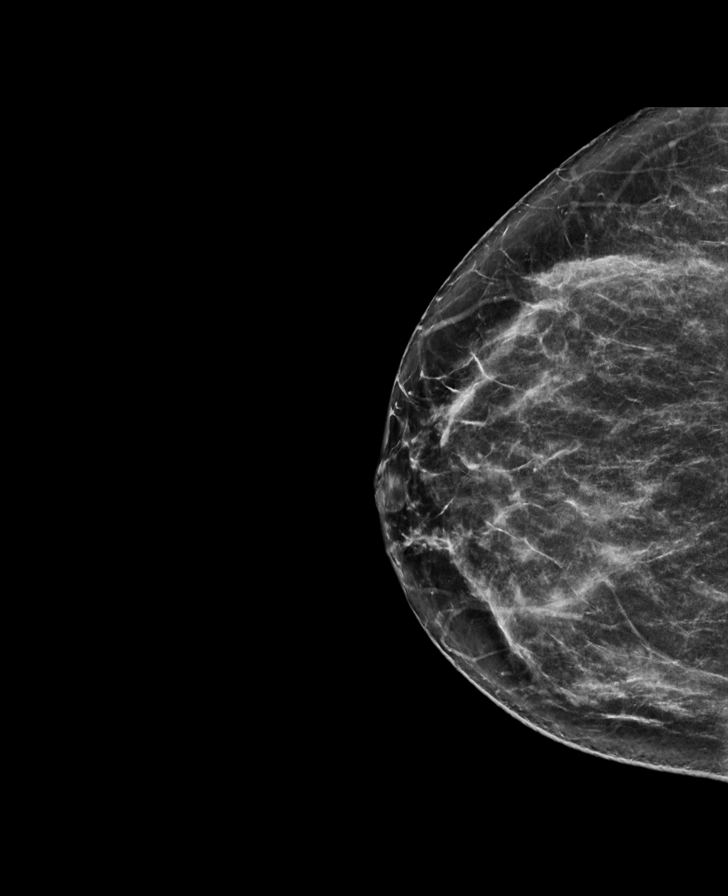

[L CC synth-2D]
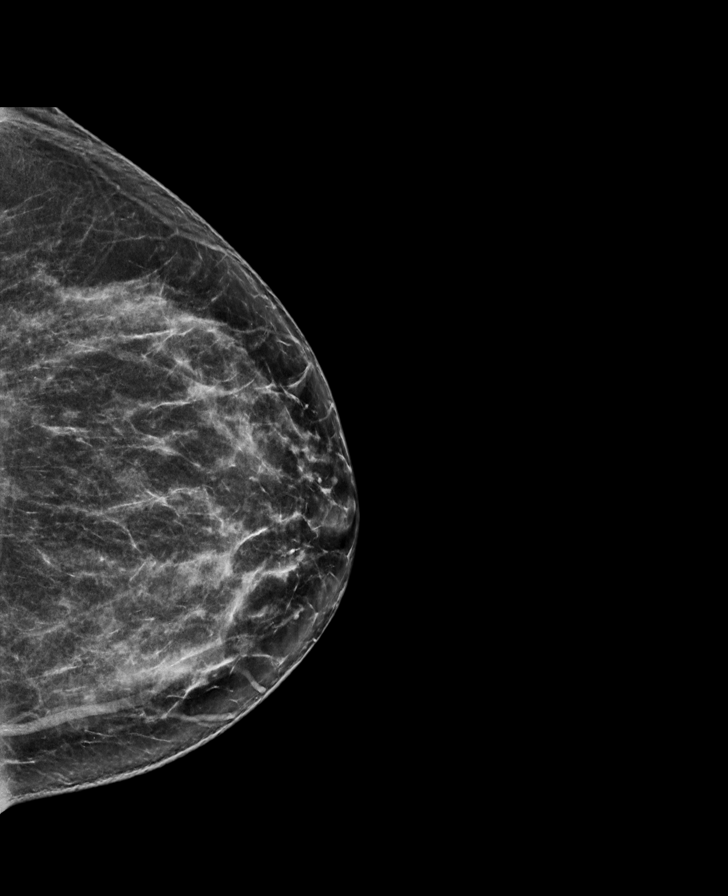

[R MLO tomo · tomo slice 41/81.0]
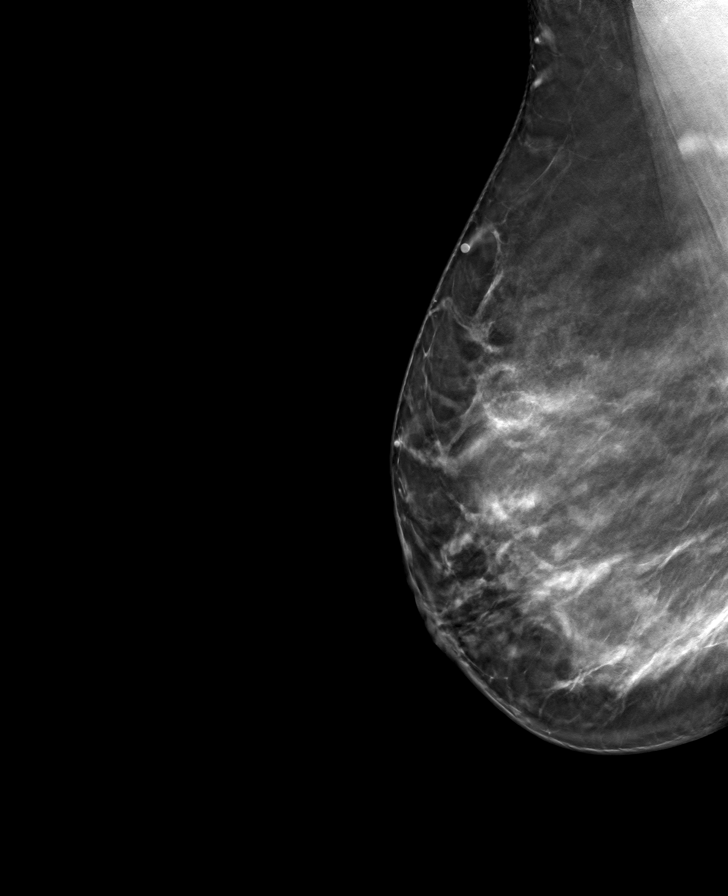

[R CC tomo · tomo slice 39/78.0]
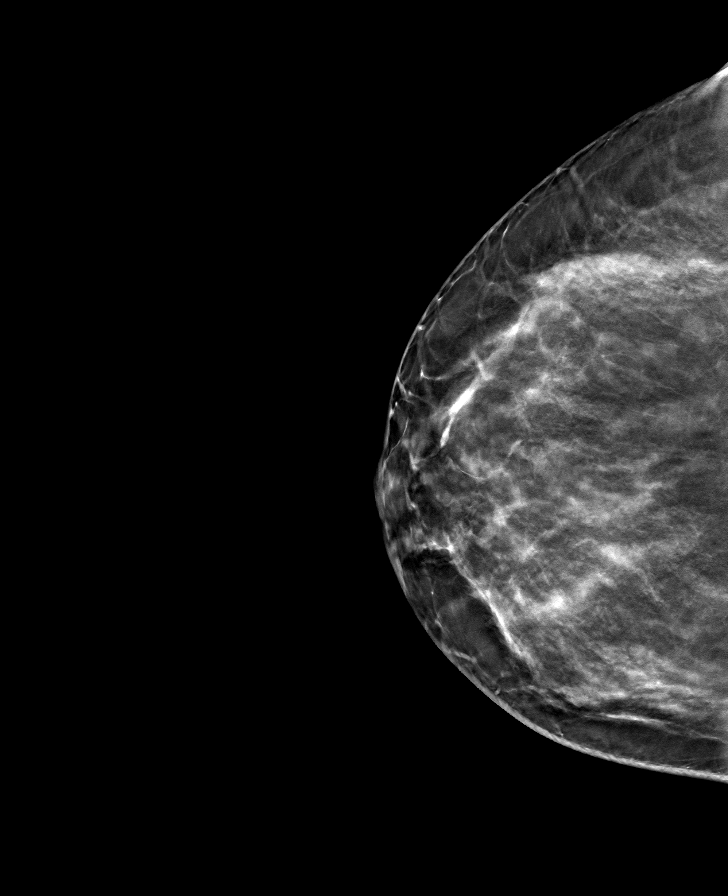

[L CC tomo · tomo slice 39/77.0]
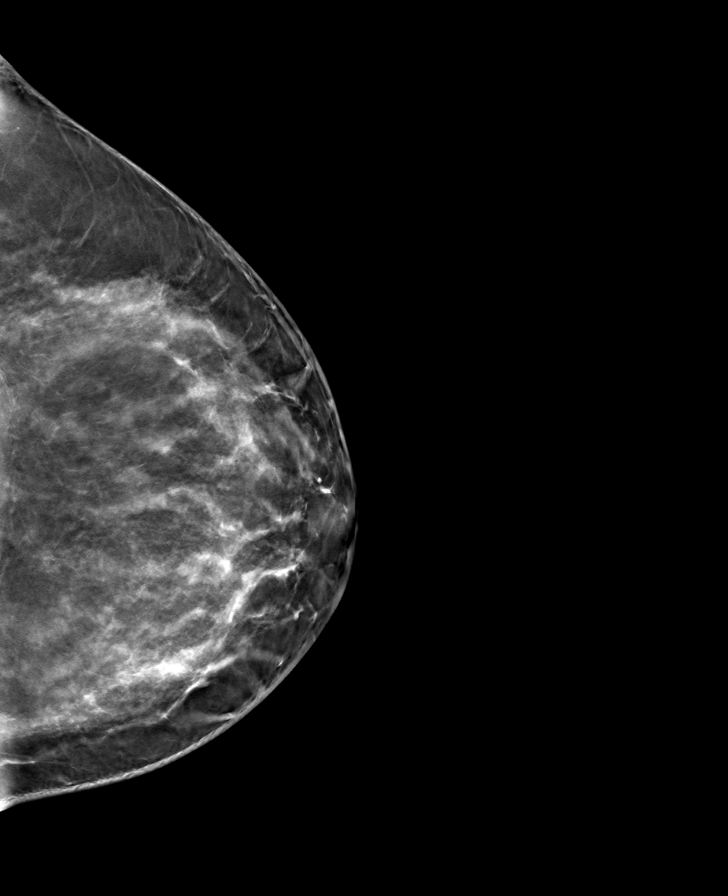

[L MLO tomo · tomo slice 43/86.0]
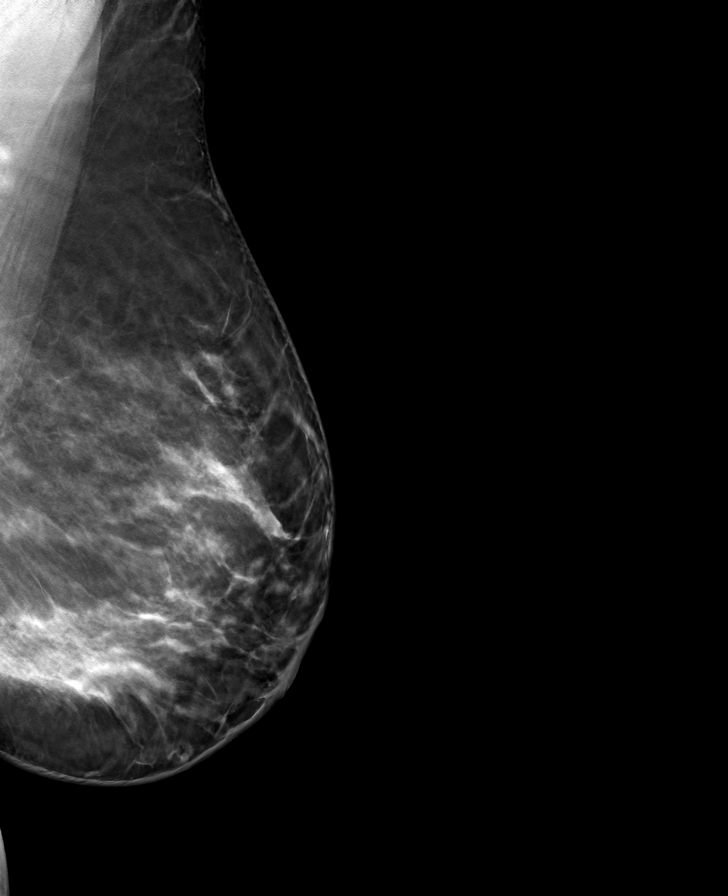

[8 of 24 positions shown; findings below may reference images not displayed]

ACR Breast Density Category b: There are scattered areas of
fibroglandular density.
FINDINGS: There are no findings suspicious for malignancy. Images were
processed with CAD.
IMPRESSION: No mammographic evidence of malignancy. A result letter of this
screening mammogram will be mailed directly to the patient.

RECOMMENDATION:
Screening mammogram in one year. (Code:[id number])

BI-RADS CATEGORY  1: Negative.

## 2023-11-01 ENCOUNTER — Emergency Department (HOSPITAL_BASED_OUTPATIENT_CLINIC_OR_DEPARTMENT_OTHER)
Admission: EM | Admit: 2023-11-01 | Discharge: 2023-11-01 | Disposition: A | Discharge status: Home / Self Care | Attending: Emergency Medicine | Admitting: Emergency Medicine

## 2023-11-01 ENCOUNTER — Other Ambulatory Visit: Payer: Self-pay

## 2023-11-01 ENCOUNTER — Encounter (HOSPITAL_BASED_OUTPATIENT_CLINIC_OR_DEPARTMENT_OTHER): Payer: Self-pay | Admitting: Emergency Medicine

## 2023-11-01 DIAGNOSIS — Z91040 Latex allergy status: Secondary | ICD-10-CM | POA: Insufficient documentation

## 2023-11-01 DIAGNOSIS — I10 Essential (primary) hypertension: Secondary | ICD-10-CM | POA: Diagnosis present

## 2023-11-01 DIAGNOSIS — Z79899 Other long term (current) drug therapy: Secondary | ICD-10-CM | POA: Diagnosis not present

## 2023-11-01 DIAGNOSIS — R030 Elevated blood-pressure reading, without diagnosis of hypertension: Secondary | ICD-10-CM

## 2023-11-01 NOTE — ED Triage Notes (Incomplete)
 Pt arrived with c/o hypertension

## 2023-11-01 NOTE — ED Triage Notes (Signed)
 Pt via pov from home with htn. States she went to dentist on Tuesday and her bp was very high. She was sent to her pcp, who gave her 2 doses of hydrochlorothiazide and had her come back for recheck. Her bp continued to be very high. She took a clonidine this morning at pcp office and was sent here because bp is still high. Denies sob, cp. Pt was prescribed losartan this AM at the same time she was sent here. Pt a&o x 4; nad noted.

## 2023-11-01 NOTE — ED Provider Notes (Signed)
 [place] EMERGENCY DEPARTMENT AT [facility] Provider Note   CSN: [id number] Arrival date & time: 11/01/23  1106     Patient presents with: Hypertension   [name] is a 53 y.o. female patient with a remote history of hypertension not currently on any antihypertensives who presents to the emergency department today for further evaluation of elevated blood pressures.  Patient states that on Tuesday she went to go get some dental work done and the dentist was going to use some lidocaine with epinephrine so she took her blood pressure prior to the procedure and it was 187/108.  Patient at that time was asymptomatic.  She was sent to her PCP.  She was started on hydrochlorothiazide and instructed to take her blood pressure in the morning and evening.  The next morning her blood pressure was still elevated.  She took her hydrochlorothiazide.  Blood pressure was still elevated.  She contacted her PCP who instructed her to take a second hydrochlorothiazide.  Last night, her blood pressure was still elevated.  She presented to her PCP office today and was still elevated.  PCP added on clonidine and waited about 45 minutes and her blood pressure was still elevated so she was sent to the emergency department for further evaluation.  Patient is completely asymptomatic.  She has no chest pain, shortness of breath, headache, focal weakness, focal numbness, visual deficits.    Hypertension       Prior to Admission medications   Medication Sig Start Date End Date Taking? Authorizing Provider  gabapentin (NEURONTIN) 300 MG capsule Take 300 mg by mouth 3 (three) times daily.    [provider]  hydrochlorothiazide (HYDRODIURIL) 25 MG tablet Take 25 mg by mouth daily.    [provider]  hydrOXYzine (ATARAX/VISTARIL) 10 MG tablet Take 10 mg by mouth as needed.    [provider]  levothyroxine (SYNTHROID, LEVOTHROID) 25 MCG tablet Take 25 mcg by mouth daily before  breakfast.    [provider]  omeprazole  (PRILOSEC) 20 MG capsule Take 1 capsule (20 mg total) by mouth daily. Patient not taking: Reported on 02/05/2018 03/30/14   Midge Golas, MD  oxyCODONE -acetaminophen  (PERCOCET/ROXICET) 5-325 MG per tablet Take 1 tablet by mouth every 4 (four) hours as needed for severe pain. Patient not taking: Reported on 02/05/2018 03/30/14   Midge Golas, MD  pravastatin (PRAVACHOL) 10 MG tablet Take 10 mg by mouth daily.    [provider]    Allergies: Latex    Review of Systems  All other systems reviewed and are negative.   Updated Vital Signs BP (!) 143/84   Pulse 73   Temp 98.1 F (36.7 C)   Resp 18   Ht 5' 8 (1.727 m)   Wt 103 kg   SpO2 100%   BMI 34.52 kg/m   Physical Exam Vitals and nursing note reviewed.  Constitutional:      General: She is not in acute distress.    Appearance: Normal appearance.  HENT:     Head: Normocephalic and atraumatic.  Eyes:     General:        Right eye: No discharge.        Left eye: No discharge.  Cardiovascular:     Comments: Regular rate and rhythm.  S1/S2 are distinct without any evidence of murmur, rubs, or gallops.  Radial pulses are 2+ bilaterally.  Dorsalis pedis pulses are 2+ bilaterally.  No evidence of pedal edema. Pulmonary:  Comments: Clear to auscultation bilaterally.  Normal effort.  No respiratory distress.  No evidence of wheezes, rales, or rhonchi heard throughout. Abdominal:     General: Abdomen is flat. Bowel sounds are normal. There is no distension.     Tenderness: There is no abdominal tenderness. There is no guarding or rebound.  Musculoskeletal:        General: Normal range of motion.     Cervical back: Neck supple.  Skin:    General: Skin is warm and dry.     Findings: No rash.  Neurological:     General: No focal deficit present.     Mental Status: She is alert.  Psychiatric:        Mood and Affect: Mood normal.        Behavior: Behavior  normal.     (all labs ordered are listed, but only abnormal results are displayed) Labs Reviewed - No data to display  EKG: EKG Interpretation Date/Time:  Thursday November 01 2023 11:22:25 EDT Ventricular Rate:  87 PR Interval:  157 QRS Duration:  90 QT Interval:  363 QTC Calculation: 437 R Axis:   1  Text Interpretation: Sinus rhythm Probable left atrial enlargement Borderline T abnormalities, lateral leads No previous ECGs available Confirmed by Midge Golas ([id number]) on 11/01/2023 11:24:23 AM  Radiology: No results found.   Procedures   Medications Ordered in the ED - No data to display  Clinical Course as of 11/01/23 1259  Thu Nov 01, 2023  1257 Patient still feeling asymptomatic.  Current blood pressure is 143/82.  Patient was prescribed losartan by her primary care doctor.  Considering that her blood pressure is within goal, I told her to hold her hydrochlorothiazide tonight and start taking the losartan tomorrow. [CF]    Clinical Course User Index [CF] Theotis Cameron HERO, PA-C    Medical Decision Making [name] is a 53 y.o. female patient who presents to the emergency department today for further evaluation of elevated blood pressures.  Initial blood pressure in triage does not meet criteria for hypertensive urgency or emergency.  Blood pressure during the interview was 150/99.  I do not feel that we need any imaging or labs at this time.  Will observe her for 1 to 2 hours and trend her blood pressures.  Again, patient is asymptomatic.  Patient agreeable with plan.  Patient has remained asymptomatic here.  Blood pressure continues to improve.  Will have her hold her hydrochlorothiazide dose tonight and start losartan tomorrow.  Will have her follow-up with her primary care doctor.  Strict return precautions were discussed.  She is safe for discharge.     Final diagnoses:  Elevated blood pressure reading    ED Discharge Orders     None           Theotis Cameron HERO DEVONNA 11/01/23 1259    Midge Golas, MD 11/01/23 [phone]

## 2023-11-01 NOTE — ED Notes (Signed)
 Pt discharged home after verbalizing understanding of discharge instructions; nad noted.

## 2023-11-01 NOTE — Discharge Instructions (Signed)
 As we discussed, I would hold the second dose of hydrochlorothiazide tonight and you can start taking the losartan in place of the hydrochlorothiazide tomorrow.  I would like for you to continue taking your blood pressure log once in the morning and once in the evening.  Please follow-up with your primary care doctor for further evaluation.  You may return to the emergency department for any worsening symptoms.

## 2023-11-27 ENCOUNTER — Other Ambulatory Visit: Payer: Self-pay

## 2023-11-27 DIAGNOSIS — Z1231 Encounter for screening mammogram for malignant neoplasm of breast: Secondary | ICD-10-CM

## 2023-11-28 ENCOUNTER — Ambulatory Visit

## 2023-12-04 ENCOUNTER — Ambulatory Visit
Admission: RE | Admit: 2023-12-04 | Discharge: 2023-12-04 | Disposition: A | Discharge status: Home / Self Care | Source: Ambulatory Visit

## 2023-12-04 DIAGNOSIS — Z1231 Encounter for screening mammogram for malignant neoplasm of breast: Secondary | ICD-10-CM

## 2023-12-05 ENCOUNTER — Encounter: Payer: Self-pay | Admitting: Internal Medicine

## 2023-12-17 ENCOUNTER — Ambulatory Visit: Attending: Internal Medicine | Admitting: Internal Medicine

## 2023-12-17 ENCOUNTER — Encounter: Payer: Self-pay | Admitting: Internal Medicine

## 2023-12-17 VITALS — BP 179/97 | HR 104 | Resp 16 | Ht 68.0 in | Wt 232.0 lb

## 2023-12-17 DIAGNOSIS — R011 Cardiac murmur, unspecified: Secondary | ICD-10-CM

## 2023-12-17 DIAGNOSIS — I10 Essential (primary) hypertension: Secondary | ICD-10-CM | POA: Diagnosis not present

## 2023-12-17 DIAGNOSIS — E782 Mixed hyperlipidemia: Secondary | ICD-10-CM

## 2023-12-17 MED ORDER — AMLODIPINE BESYLATE 5 MG PO TABS: 5.0000 mg | ORAL_TABLET | Freq: Every day | ORAL | 3 refills | Status: DC

## 2023-12-17 MED ORDER — CHLORTHALIDONE 25 MG PO TABS: 25.0000 mg | ORAL_TABLET | Freq: Every day | ORAL | 3 refills | Status: DC

## 2023-12-17 NOTE — Patient Instructions (Addendum)
  Medication Instructions:  STOP clonidine  FOR BP: START chlorthalidone 25mg  daily START amlodipine 5mg  daily CONTINUE losartan     Check your BP at home 2 hours after taking meds in the morning and after resting. Send a message in MyChart with 2 weeks of home BP readings.   If your home BP is >180 (top number), please contact office or go to ED if having symptoms.    *If you need a refill on your cardiac medications before your next appointment, please call your pharmacy*  Lab Work: NON-FASTING BMET week of 10/20  If you have labs (blood work) drawn today and your tests are completely normal, you will receive your results only by: MyChart Message (if you have MyChart) OR A paper copy in the mail If you have any lab test that is abnormal or we need to change your treatment, we will call you to review the results.  Testing/Procedures: Your physician has requested that you have an echocardiogram. Echocardiography is a painless test that uses sound waves to create images of your heart. It provides your doctor with information about the size and shape of your heart and how well your heart's chambers and valves are working. This procedure takes approximately one hour. There are no restrictions for this procedure. Please do NOT wear cologne, perfume, aftershave, or lotions (deodorant is allowed). Please arrive 15 minutes prior to your appointment time.  Please note: We ask at that you not bring children with you during ultrasound (echo/ vascular) testing. Due to room size and safety concerns, children are not allowed in the ultrasound rooms during exams. Our front office staff cannot provide observation of children in our lobby area while testing is being conducted. An adult accompanying a patient to their appointment will only be allowed in the ultrasound room at the discretion of the ultrasound technician under special circumstances. We apologize for any inconvenience.   Follow-Up: At  [facility], you and your health needs are our priority.  As part of our continuing mission to provide you with exceptional heart care, our providers are all part of one team.  This team includes your primary Cardiologist (physician) and Advanced Practice Providers or APPs (Physician Assistants and Nurse Practitioners) who all work together to provide you with the care you need, when you need it.  Your next appointment:    FOLLOW UP in 3 months with Dr. Kriste  We recommend signing up for the patient portal called MyChart.  Sign up information is provided on this After Visit Summary.  MyChart is used to connect with patients for Virtual Visits (Telemedicine).  Patients are able to view lab/test results, encounter notes, upcoming appointments, etc.  Non-urgent messages can be sent to your provider as well.   To learn more about what you can do with MyChart, go to [email or web address].   Other Instructions  Please limit your salt/sodium intake. References for low-sodium diet provided

## 2023-12-17 NOTE — Progress Notes (Signed)
 Cardiology Office Note   Date:  12/17/2023  ID:  [name], DOB [birth year], MRN [id number] PCP: Durel Riggs, FNP  [facility] Providers Cardiologist:  None     History of Present Illness [name] is a 53 y.o. female with a past medical history of dyslipidemia, poorly controlled hypertension which prompted the referral today.  Patient states that she went for a dental procedure in August and was found to be hypertensive at that time which was a new diagnosis for her.  Since then she has had multiple medication changes to help get this under control.  Initially on hydrochlorothiazide without improvement followed by losartan followed by clonidine 0.1 mg added on then increase to 0.2 mg (currently on losartan 25 mg and clonidine 0.2 mg nightly).  Despite this regimen she is still getting significantly elevated blood pressures without any symptoms at all.  Her blood pressure recordings are as below.  She takes her blood pressure before and after taking her antihypertensives.  She has not been able to get any controlled blood pressures until her clonidine was increased but during the day she still has spikes.  She has even noted her blood pressure greater than 200/110 but this was during an episode of severe stress.  Has a family history of hypertension.  Tobacco use: no Alcohol use: no     ROS:  Review of Systems  All other systems reviewed and are negative.   Physical Exam  Physical Exam Vitals and nursing note reviewed.  Constitutional:      Appearance: Normal appearance.  HENT:     Head: Normocephalic and atraumatic.  Eyes:     Conjunctiva/sclera: Conjunctivae normal.  Neck:     Vascular: No carotid bruit.  Cardiovascular:     Rate and Rhythm: Normal rate and regular rhythm.     Heart sounds: Murmur heard.  Pulmonary:     Effort: Pulmonary effort is normal.     Breath sounds: Normal breath sounds.  Musculoskeletal:        General: No swelling or  tenderness.  Skin:    Coloration: Skin is not jaundiced or pale.  Neurological:     Mental Status: She is alert.     VS:  BP (!) 179/97 (BP Location: Left Arm, Patient Position: Sitting, Cuff Size: Large)   Pulse (!) 104   Resp 16   Ht 5' 8 (1.727 m)   Wt 232 lb (105.2 kg)   SpO2 96%   BMI 35.28 kg/m         Wt Readings from Last 3 Encounters:  12/17/23 232 lb (105.2 kg)  11/01/23 227 lb (103 kg)  02/18/18 237 lb (107.5 kg)     EKG Interpretation Date/Time:  Monday December 17 2023 10:03:01 EDT Ventricular Rate:  96 PR Interval:  158 QRS Duration:  88 QT Interval:  356 QTC Calculation: 449 R Axis:   -25  Text Interpretation: Normal sinus rhythm Possible Left atrial enlargement Left ventricular hypertrophy ( R in aVL , Cornell product ) When compared with ECG of 01-Nov-2023 11:22, Left ventricular hypertrophy NOW PRESENT Confirmed by Kriste Hicks [phone]) on 12/17/2023 10:04:50 AM    Studies Reviewed        Risk Assessment/Calculations    HYPERTENSION CONTROL Vitals:   12/17/23 0959 12/17/23 1003  BP: (!) 174/105 (!) 179/97    The patient's blood pressure is elevated above target today.  In order to address the patient's elevated BP: A new medication was prescribed  today. (No medications prescribed)            ASSESSMENT  Poorly controlled hypertension BP very poorly controlled despite losartan 25 mg and clonidine 0.2 mg nightly.  Prefer to stay away from clonidine as this can cause rebound spikes in between doses especially if skipping a dose accidentally. Hyperlipidemia lipid panel 11/14/2023: Total cholesterol 235, triglycerides 81, HDL 85, LDL 132 Systolic murmur   Plan  Will discontinue clonidine and prefer to stay away from this medication Continue losartan 25 mg Start on amlodipine 5 mg Start on chlorthalidone 25 mg She is going to message with her blood pressure recordings in 2 weeks and we will adjust medications accordingly If she has  persistent spikes in blood pressure despite this regimen then she was told to contact us  earlier and go to the ED if she is having any symptoms Limit salt intake BMP in 2 weeks Echocardiogram to evaluate for LVH and the systolic murmur At next visit we can look into secondary causes of hypertension including hyperaldosteronism, renal artery stenosis and OSA  Follow up: 3 months          Signed, Emeline FORBES Calender, MD

## 2023-12-19 ENCOUNTER — Encounter: Payer: Self-pay | Admitting: Internal Medicine

## 2023-12-19 ENCOUNTER — Telehealth: Payer: Self-pay | Admitting: Internal Medicine

## 2023-12-19 DIAGNOSIS — I10 Essential (primary) hypertension: Secondary | ICD-10-CM

## 2023-12-19 NOTE — Telephone Encounter (Signed)
 MyChart encounter sent to patient.  Will have the patient go back to her original clonidine and losartan dosing and stop amlodipine and chlorthalidone for now as she is having palpitations and nausea which could be from electrolyte disturbance from the chlorthalidone.  Will order a BMP, magnesium, Aldo/renin ratio and renal artery ultrasound for secondary hypertension workup.  Emeline Calender, DO

## 2023-12-31 ENCOUNTER — Telehealth: Payer: Self-pay | Admitting: Internal Medicine

## 2023-12-31 ENCOUNTER — Encounter: Payer: Self-pay | Admitting: Internal Medicine

## 2023-12-31 MED ORDER — AMLODIPINE BESYLATE 5 MG PO TABS: 10.0000 mg | ORAL_TABLET | Freq: Every day | ORAL | Status: DC

## 2023-12-31 NOTE — Telephone Encounter (Signed)
Increased amlodipine to 10mg

## 2024-01-01 ENCOUNTER — Ambulatory Visit: Payer: Self-pay | Admitting: Internal Medicine

## 2024-01-04 LAB — BASIC METABOLIC PANEL WITH GFR
BUN/Creatinine Ratio: 17 (ref 9–23)
BUN: 13 mg/dL (ref 6–24)
CO2: 20 mmol/L (ref 20–29)
Calcium: 9.6 mg/dL (ref 8.7–10.2)
Chloride: 102 mmol/L (ref 96–106)
Creatinine, Ser: 0.76 mg/dL (ref 0.57–1.00)
Glucose: 83 mg/dL (ref 70–99)
Potassium: 4.2 mmol/L (ref 3.5–5.2)
Sodium: 140 mmol/L (ref 134–144)
eGFR: 95 mL/min/1.73 (ref >59)

## 2024-01-04 LAB — ALDOSTERONE + RENIN ACTIVITY W/ RATIO
Aldos/Renin Ratio: 10.4 (ref 0.0–30.0)
Aldosterone: 10.1 ng/dL (ref 0.0–30.0)
Renin Activity, Plasma: 0.967 ng/mL/h (ref 0.167–5.380)

## 2024-01-04 LAB — MAGNESIUM: Magnesium: 2.2 mg/dL (ref 1.6–2.3)

## 2024-01-04 MED ORDER — HYDROCHLOROTHIAZIDE 25 MG PO TABS
25.0000 mg | ORAL_TABLET | Freq: Every day | ORAL | 3 refills | Status: AC
Start: 2024-01-04 — End: 2024-04-03

## 2024-01-04 NOTE — Telephone Encounter (Signed)
 Per Dr. Kriste - D/C chlorthalidone. Rx hydrochlorothiazide 25mg  every day Rx(s) sent to pharmacy electronically. Patient updated in MyChart

## 2024-01-11 ENCOUNTER — Ambulatory Visit (HOSPITAL_COMMUNITY)
Admission: RE | Admit: 2024-01-11 | Discharge: 2024-01-11 | Disposition: A | Discharge status: Home / Self Care | Source: Ambulatory Visit | Attending: Internal Medicine | Admitting: Internal Medicine

## 2024-01-11 DIAGNOSIS — I10 Essential (primary) hypertension: Secondary | ICD-10-CM | POA: Insufficient documentation

## 2024-01-23 ENCOUNTER — Ambulatory Visit: Payer: Self-pay | Admitting: Internal Medicine

## 2024-01-23 ENCOUNTER — Ambulatory Visit (HOSPITAL_COMMUNITY)
Admission: RE | Admit: 2024-01-23 | Discharge: 2024-01-23 | Disposition: A | Discharge status: Home / Self Care | Source: Ambulatory Visit | Attending: Internal Medicine | Admitting: Internal Medicine

## 2024-01-23 DIAGNOSIS — I10 Essential (primary) hypertension: Secondary | ICD-10-CM

## 2024-01-24 LAB — ECHOCARDIOGRAM COMPLETE
Area-P 1/2: 3.66 cm2
S' Lateral: 2.3 cm

## 2024-01-25 NOTE — Telephone Encounter (Signed)
 I called and spoke with the patient. I gave her the results of her echo and let her know of Dr. Ali comments and next steps in her care. She was receptive and would like to proceed with the repeat limited echo with contrast. I will put in the order and make sure she gets scheduled with that echo.

## 2024-01-29 ENCOUNTER — Ambulatory Visit (HOSPITAL_COMMUNITY)
Admission: RE | Admit: 2024-01-29 | Discharge: 2024-01-29 | Disposition: A | Discharge status: Home / Self Care | Source: Ambulatory Visit | Attending: Internal Medicine | Admitting: Internal Medicine

## 2024-01-29 DIAGNOSIS — I10 Essential (primary) hypertension: Secondary | ICD-10-CM | POA: Diagnosis present

## 2024-01-29 LAB — ECHOCARDIOGRAM LIMITED
Area-P 1/2: 3.8 cm2
S' Lateral: 2.6 cm

## 2024-01-29 MED ORDER — PERFLUTREN LIPID MICROSPHERE
1.0000 mL | INTRAVENOUS | Status: AC | PRN
  Administered 2024-01-29: 1 mL via INTRAVENOUS

## 2024-01-31 ENCOUNTER — Ambulatory Visit: Payer: Self-pay | Admitting: Internal Medicine

## 2024-02-12 ENCOUNTER — Encounter: Payer: Self-pay | Admitting: Internal Medicine

## 2024-02-13 MED ORDER — AMLODIPINE BESYLATE 10 MG PO TABS: 10.0000 mg | ORAL_TABLET | Freq: Every day | ORAL | 3 refills | Status: AC

## 2024-03-19 ENCOUNTER — Ambulatory Visit: Attending: Cardiovascular Disease | Admitting: Internal Medicine

## 2024-03-19 ENCOUNTER — Encounter: Payer: Self-pay | Admitting: Internal Medicine

## 2024-03-19 VITALS — BP 146/84 | HR 84 | Ht 68.0 in | Wt 235.2 lb

## 2024-03-19 DIAGNOSIS — I5189 Other ill-defined heart diseases: Secondary | ICD-10-CM | POA: Diagnosis not present

## 2024-03-19 DIAGNOSIS — I10 Essential (primary) hypertension: Secondary | ICD-10-CM | POA: Diagnosis not present

## 2024-03-19 MED ORDER — LOSARTAN POTASSIUM 50 MG PO TABS: 50.0000 mg | ORAL_TABLET | Freq: Every day | ORAL | 3 refills | Status: AC

## 2024-03-19 NOTE — Patient Instructions (Signed)
 Medication Instructions:  Your physician recommends that you continue on your current medications as directed. Please refer to the Current Medication list given to you today.  *If you need a refill on your cardiac medications before your next appointment, please call your pharmacy*  Lab Work: None.  If you have labs (blood work) drawn today and your tests are completely normal, you will receive your results only by: MyChart Message (if you have MyChart) OR A paper copy in the mail If you have any lab test that is abnormal or we need to change your treatment, we will call you to review the results.  Testing/Procedures: None.  Follow-Up: At [facility], you and your health needs are our priority.  As part of our continuing mission to provide you with exceptional heart care, our providers are all part of one team.  This team includes your primary Cardiologist (physician) and Advanced Practice Providers or APPs (Physician Assistants and Nurse Practitioners) who all work together to provide you with the care you need, when you need it.  Your next appointment:   1 year(s)  Provider:   Dr. Emeline Calender, DO

## 2024-03-19 NOTE — Progress Notes (Signed)
 " Cardiology Office Note:  .   Date:  03/19/2024  ID:  [name], DOB [birth year], MRN [id number] PCP: Durel Riggs, FNP  [facility] Providers Cardiologist:  None    History of Present Illness: .    [name] is a 54 y.o. female with a past medical history of poorly controlled hypertension and dyslipidemia who presents today for follow-up.  Discussed the use of AI scribe software for clinical note transcription with the patient, who gave verbal consent to proceed.  History of Present Illness [name] is a 54 year old female with poorly controlled hypertension and dyslipidemia who presents for follow-up. She was referred by a dentist after being found hypertensive during a dental procedure.  She has a history of poorly controlled hypertension, initially discovered during a dental procedure in August 2025. Initial treatment with hydrochlorothiazide  was ineffective, leading to the addition of losartan  and clonidine 0.1 mg, which was later increased to 0.2 mg nightly. Despite these medications, her blood pressure remained elevated, prompting a referral to the current provider. Her medication regimen was adjusted to amlodipine  10 mg, hydrochlorothiazide  25 mg, and losartan  50 mg.  A renal artery ultrasound on January 11, 2024, was negative for bilateral renal artery stenosis. An echocardiogram on January 23, 2024, was performed, and the patient recalls that a follow-up limited echo with Dfinity contrast was done for further evaluation. A limited echo with Dfinity contrast did not show apical hypertrophy.  Her blood pressure today is 146/84, but she reports that at home it is usually in the 120s/70s-80s after relaxing. She feels well on her current medications and has not experienced any chest pain or shortness of breath. She did experience swelling on one occasion last week after being on her feet all day, which she attributes to her shoes.  No regular issues with  fatigue during the day, although she felt tired today. She is tapering off coffee, which may have contributed to her fatigue. She sleeps alone and is unsure if she snores, but she occasionally wakes up with a 'weird cough' that was previously frequent enough to warrant an x-ray. The cough has since resolved after taking antibiotics.       ROS: Remaining review of systems negative  Studies Reviewed: .        Results Labs Aldosterone/renin ratio (12/31/2023): Normal  Radiology Renal artery ultrasound (01/11/2024): No evidence of bilateral renal artery stenosis  Diagnostic Echocardiogram (01/23/2024): Severe asymmetric left ventricular hypertrophy of the basal segment, ejection fraction 60-65%, grade 1 diastolic dysfunction, no apical hypertrophy on follow-up limited echocardiogram with Dfinity contrast Risk Assessment/Calculations:       Physical Exam:   VS:  BP (!) 146/84   Pulse 84   Ht 5' 8 (1.727 m)   Wt 235 lb 3.2 oz (106.7 kg)   SpO2 97%   BMI 35.76 kg/m    Wt Readings from Last 3 Encounters:  03/19/24 235 lb 3.2 oz (106.7 kg)  12/17/23 232 lb (105.2 kg)  11/01/23 227 lb (103 kg)    GEN: Well nourished, well developed in no acute distress NECK: No JVD; No carotid bruits CARDIAC:  RRR, no murmurs, no rubs, no gallops RESPIRATORY:  Clear to auscultation without rales, wheezing or rhonchi  ABDOMEN: Soft, non-tender, non-distended EXTREMITIES:  No edema; No deformity   ASSESSMENT AND PLAN: .    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 Echocardiogram shows severe asymmetric  left ventricular hypertrophy and grade 1 diastolic dysfunction. No secondary causes identified. No significant symptoms reported. - Continue amlodipine  10 mg, hydrochlorothiazide  25 mg, losartan  25 mg. - Send blood pressure readings in two weeks for review. - Follow up in one year unless  symptoms change.            Follow up: 1 year  Signed, Emeline FORBES Calender, DO  03/19/2024 4:41 PM    [facility] "
